# Patient Record
Sex: Female | Born: 1935 | Race: White | Hispanic: No | Marital: Married | Smoking: Former smoker
Health system: Southern US, Community
[De-identification: ages and names within clinical notes are randomized; demographics above are authoritative.]

## PROBLEM LIST (undated history)

## (undated) DIAGNOSIS — I639 Cerebral infarction, unspecified: Secondary | ICD-10-CM

## (undated) DIAGNOSIS — C801 Malignant (primary) neoplasm, unspecified: Secondary | ICD-10-CM

## (undated) DIAGNOSIS — I749 Embolism and thrombosis of unspecified artery: Secondary | ICD-10-CM

## (undated) DIAGNOSIS — I2510 Atherosclerotic heart disease of native coronary artery without angina pectoris: Secondary | ICD-10-CM

## (undated) DIAGNOSIS — E785 Hyperlipidemia, unspecified: Secondary | ICD-10-CM

## (undated) DIAGNOSIS — I10 Essential (primary) hypertension: Secondary | ICD-10-CM

## (undated) DIAGNOSIS — Z8719 Personal history of other diseases of the digestive system: Secondary | ICD-10-CM

## (undated) DIAGNOSIS — I4891 Unspecified atrial fibrillation: Secondary | ICD-10-CM

## (undated) DIAGNOSIS — M1990 Unspecified osteoarthritis, unspecified site: Secondary | ICD-10-CM

## (undated) DIAGNOSIS — Z8619 Personal history of other infectious and parasitic diseases: Secondary | ICD-10-CM

## (undated) HISTORY — DX: Unspecified atrial fibrillation: I48.91

## (undated) HISTORY — PX: TONSILLECTOMY: SUR1361

## (undated) HISTORY — DX: Personal history of other diseases of the digestive system: Z87.19

## (undated) HISTORY — DX: Personal history of other infectious and parasitic diseases: Z86.19

## (undated) HISTORY — DX: Cerebral infarction, unspecified: I63.9

## (undated) HISTORY — DX: Atherosclerotic heart disease of native coronary artery without angina pectoris: I25.10

## (undated) HISTORY — DX: Embolism and thrombosis of unspecified artery: I74.9

## (undated) HISTORY — DX: Hyperlipidemia, unspecified: E78.5

## (undated) HISTORY — DX: Malignant (primary) neoplasm, unspecified: C80.1

## (undated) HISTORY — PX: ABDOMINAL HYSTERECTOMY: SHX81

## (undated) HISTORY — DX: Essential (primary) hypertension: I10

## (undated) HISTORY — DX: Unspecified osteoarthritis, unspecified site: M19.90

## (undated) HISTORY — PX: APPENDECTOMY: SHX54

---

## 2011-05-30 ENCOUNTER — Inpatient Hospital Stay (HOSPITAL_COMMUNITY)
Admission: EM | Admit: 2011-05-30 | Discharge: 2011-06-04 | DRG: 263 | Disposition: A | Discharge status: Skilled Nursing Facility | Payer: Medicare Other | Attending: Vascular Surgery | Admitting: Vascular Surgery

## 2011-05-30 ENCOUNTER — Emergency Department (HOSPITAL_COMMUNITY): Payer: Medicare Other

## 2011-05-30 ENCOUNTER — Other Ambulatory Visit: Payer: Self-pay | Admitting: Vascular Surgery

## 2011-05-30 DIAGNOSIS — I824Y9 Acute embolism and thrombosis of unspecified deep veins of unspecified proximal lower extremity: Principal | ICD-10-CM | POA: Diagnosis present

## 2011-05-30 DIAGNOSIS — Z7901 Long term (current) use of anticoagulants: Secondary | ICD-10-CM

## 2011-05-30 DIAGNOSIS — I10 Essential (primary) hypertension: Secondary | ICD-10-CM | POA: Diagnosis present

## 2011-05-30 DIAGNOSIS — Z8673 Personal history of transient ischemic attack (TIA), and cerebral infarction without residual deficits: Secondary | ICD-10-CM

## 2011-05-30 DIAGNOSIS — I4891 Unspecified atrial fibrillation: Secondary | ICD-10-CM

## 2011-05-30 DIAGNOSIS — I743 Embolism and thrombosis of arteries of the lower extremities: Secondary | ICD-10-CM

## 2011-05-30 DIAGNOSIS — Y849 Medical procedure, unspecified as the cause of abnormal reaction of the patient, or of later complication, without mention of misadventure at the time of the procedure: Secondary | ICD-10-CM | POA: Diagnosis not present

## 2011-05-30 DIAGNOSIS — Z8542 Personal history of malignant neoplasm of other parts of uterus: Secondary | ICD-10-CM

## 2011-05-30 DIAGNOSIS — I824Z9 Acute embolism and thrombosis of unspecified deep veins of unspecified distal lower extremity: Secondary | ICD-10-CM | POA: Diagnosis present

## 2011-05-30 DIAGNOSIS — Y9210 Unspecified residential institution as the place of occurrence of the external cause: Secondary | ICD-10-CM | POA: Diagnosis not present

## 2011-05-30 DIAGNOSIS — Z882 Allergy status to sulfonamides status: Secondary | ICD-10-CM

## 2011-05-30 DIAGNOSIS — Z823 Family history of stroke: Secondary | ICD-10-CM

## 2011-05-30 DIAGNOSIS — B029 Zoster without complications: Secondary | ICD-10-CM | POA: Diagnosis present

## 2011-05-30 DIAGNOSIS — M1710 Unilateral primary osteoarthritis, unspecified knee: Secondary | ICD-10-CM | POA: Diagnosis present

## 2011-05-30 DIAGNOSIS — R110 Nausea: Secondary | ICD-10-CM | POA: Diagnosis not present

## 2011-05-30 DIAGNOSIS — Z79899 Other long term (current) drug therapy: Secondary | ICD-10-CM

## 2011-05-30 DIAGNOSIS — D62 Acute posthemorrhagic anemia: Secondary | ICD-10-CM | POA: Diagnosis not present

## 2011-05-30 DIAGNOSIS — E785 Hyperlipidemia, unspecified: Secondary | ICD-10-CM | POA: Diagnosis present

## 2011-05-30 DIAGNOSIS — Z96659 Presence of unspecified artificial knee joint: Secondary | ICD-10-CM

## 2011-05-30 DIAGNOSIS — IMO0002 Reserved for concepts with insufficient information to code with codable children: Secondary | ICD-10-CM | POA: Diagnosis not present

## 2011-05-30 DIAGNOSIS — I2510 Atherosclerotic heart disease of native coronary artery without angina pectoris: Secondary | ICD-10-CM | POA: Diagnosis present

## 2011-05-30 HISTORY — PX: OTHER SURGICAL HISTORY: SHX169

## 2011-05-30 LAB — HEPARIN LEVEL (UNFRACTIONATED): Heparin Unfractionated: 1.1 IU/mL — ABNORMAL HIGH (ref 0.30–0.70)

## 2011-05-30 LAB — CBC
MCH: 29.7 pg (ref 26.0–34.0)
MCHC: 33.3 g/dL (ref 30.0–36.0)
Platelets: 240 10*3/uL (ref 150–400)

## 2011-05-30 LAB — BASIC METABOLIC PANEL
BUN: 20 mg/dL (ref 6–23)
CO2: 28 mEq/L (ref 19–32)
Calcium: 8.8 mg/dL (ref 8.4–10.5)
GFR calc non Af Amer: >60 mL/min (ref >60)
Glucose, Bld: 125 mg/dL — ABNORMAL HIGH (ref 70–99)

## 2011-05-30 LAB — DIFFERENTIAL
Basophils Relative: 0 % (ref 0–1)
Eosinophils Absolute: 0.1 10*3/uL (ref 0.0–0.7)
Eosinophils Relative: 1 % (ref 0–5)
Lymphocytes Relative: 21 % (ref 12–46)
Monocytes Absolute: 0.7 10*3/uL (ref 0.1–1.0)
Monocytes Relative: 10 % (ref 3–12)
Neutro Abs: 5.1 10*3/uL (ref 1.7–7.7)
Neutrophils Relative %: 69 % (ref 43–77)

## 2011-05-30 LAB — TYPE AND SCREEN

## 2011-05-30 LAB — PROTIME-INR: Prothrombin Time: 15.1 seconds (ref 11.6–15.2)

## 2011-05-30 LAB — MRSA PCR SCREENING: MRSA by PCR: POSITIVE — AB

## 2011-05-31 LAB — CBC
MCV: 90.2 fL (ref 78.0–100.0)
Platelets: 240 10*3/uL (ref 150–400)
RBC: 3.89 MIL/uL (ref 3.87–5.11)
RDW: 13.9 % (ref 11.5–15.5)
WBC: 7.2 10*3/uL (ref 4.0–10.5)

## 2011-05-31 LAB — BASIC METABOLIC PANEL
Chloride: 103 mEq/L (ref 96–112)
Creatinine, Ser: 0.73 mg/dL (ref 0.50–1.10)
GFR calc Af Amer: >60 mL/min (ref >60)
GFR calc non Af Amer: >60 mL/min (ref >60)
Potassium: 3.8 mEq/L (ref 3.5–5.1)

## 2011-05-31 LAB — DIFFERENTIAL
Basophils Absolute: 0 10*3/uL (ref 0.0–0.1)
Eosinophils Absolute: 0.1 10*3/uL (ref 0.0–0.7)
Eosinophils Relative: 1 % (ref 0–5)
Lymphs Abs: 1.5 10*3/uL (ref 0.7–4.0)
Neutrophils Relative %: 64 % (ref 43–77)

## 2011-05-31 LAB — HEPARIN LEVEL (UNFRACTIONATED): Heparin Unfractionated: 0.67 IU/mL (ref 0.30–0.70)

## 2011-06-01 DIAGNOSIS — I4891 Unspecified atrial fibrillation: Secondary | ICD-10-CM

## 2011-06-01 DIAGNOSIS — I059 Rheumatic mitral valve disease, unspecified: Secondary | ICD-10-CM

## 2011-06-01 LAB — CBC
HCT: 29.9 % — ABNORMAL LOW (ref 36.0–46.0)
Hemoglobin: 10.4 g/dL — ABNORMAL LOW (ref 12.0–15.0)
MCV: 88.5 fL (ref 78.0–100.0)
RBC: 3.38 MIL/uL — ABNORMAL LOW (ref 3.87–5.11)
RDW: 13.3 % (ref 11.5–15.5)
WBC: 8.1 10*3/uL (ref 4.0–10.5)

## 2011-06-01 LAB — BASIC METABOLIC PANEL
CO2: 28 mEq/L (ref 19–32)
Chloride: 101 mEq/L (ref 96–112)
Creatinine, Ser: 0.82 mg/dL (ref 0.50–1.10)
GFR calc Af Amer: >60 mL/min (ref >60)
Sodium: 136 mEq/L (ref 135–145)

## 2011-06-01 LAB — DIFFERENTIAL
Basophils Absolute: 0 10*3/uL (ref 0.0–0.1)
Eosinophils Relative: 2 % (ref 0–5)
Lymphocytes Relative: 21 % (ref 12–46)
Lymphs Abs: 1.7 10*3/uL (ref 0.7–4.0)
Neutro Abs: 5.5 10*3/uL (ref 1.7–7.7)
Neutrophils Relative %: 67 % (ref 43–77)

## 2011-06-01 LAB — PROTIME-INR
INR: 1.14 (ref 0.00–1.49)
Prothrombin Time: 14.8 seconds (ref 11.6–15.2)

## 2011-06-02 DIAGNOSIS — M79609 Pain in unspecified limb: Secondary | ICD-10-CM

## 2011-06-02 LAB — DIFFERENTIAL
Basophils Relative: 0 % (ref 0–1)
Eosinophils Absolute: 0.1 10*3/uL (ref 0.0–0.7)
Lymphs Abs: 2.1 10*3/uL (ref 0.7–4.0)
Monocytes Relative: 14 % — ABNORMAL HIGH (ref 3–12)
Neutro Abs: 3.8 10*3/uL (ref 1.7–7.7)
Neutrophils Relative %: 54 % (ref 43–77)

## 2011-06-02 LAB — PROTIME-INR: INR: 1.66 — ABNORMAL HIGH (ref 0.00–1.49)

## 2011-06-02 LAB — CBC
Hemoglobin: 8.5 g/dL — ABNORMAL LOW (ref 12.0–15.0)
MCV: 88.2 fL (ref 78.0–100.0)
Platelets: 219 10*3/uL (ref 150–400)
RBC: 2.89 MIL/uL — ABNORMAL LOW (ref 3.87–5.11)
WBC: 7.1 10*3/uL (ref 4.0–10.5)

## 2011-06-02 LAB — HEPARIN LEVEL (UNFRACTIONATED): Heparin Unfractionated: 0.37 IU/mL (ref 0.30–0.70)

## 2011-06-02 NOTE — Consult Note (Signed)
NAMEMarland Kitchen  Connie Baker, Connie Baker NO.:  [id number]  MEDICAL RECORD NO.:  [id number]  LOCATION:  2307                         FACILITY:  [facility]  PHYSICIAN:  Luis Abed, MD, FACCDATE OF BIRTH:  09-Jun-1929  DATE OF CONSULTATION:  05/30/2011 DATE OF DISCHARGE:                                CONSULTATION   HISTORY OF PRESENT ILLNESS:  The patient is admitted with a clot in her left leg.  She has undergone left leg embolectomy and she is stabilizing.  She has atrial fibrillation.  The rate is controlled.  We are consulted to decide about medications going forward and to assess her cardiac status.  The patient is followed by Dr. Graciella Freer in [place].  With the patient and her family responding to my questions, there is no definite proof of coronary artery disease.  They tell me that she has had atrial fibrillation in the past.  She has also had CVAs.  She has also had a significant bleeding GI ulcer that required 2 units transfusion in September 2011.  She is admitted on aspirin and Plavix.  I will have to contact Dr. Graciella Freer to get more specific information on the history and the choice of antiplatelet medicines and anticoagulants over time.  The patient has not been having any chest pain or shortness of breath.  PAST MEDICAL HISTORY:  Allergies to SULFA.  MEDICATIONS: 1. Vitamin B12. 2. Plavix 75 daily. 3. Protonix 40 daily. 4. Metoprolol 25 daily. 5. Fish oil. 6. Lyrica. 7. Crestor.  OTHER MEDICAL PROBLEMS:  See the complete list below.  SOCIAL HISTORY:  She does not smoke.  FAMILY HISTORY:  There is no coronary disease at a young age.  REVIEW OF SYSTEMS:  The patient has some nausea now early post surgery. She denies fever, chills, headache, sweats, rash, change in vision, change in hearing, chest pain, cough, nausea, vomiting, urinary symptoms.  Her leg of course was very painful until it was embolectomized.  All other systems are reviewed and are  negative.  PHYSICAL EXAMINATION:  GENERAL/VITAL SIGNS:  The patient is nauseated but stable in bed.  Blood pressure is 130/94.  Her pulse rate is 94. The patient is oriented to person, time, and place.  Affect is normal. HEENT:  Head is atraumatic.  She has a cold washcloth on her forehead. NECK:  There is no carotid bruit.  There is no jugular venous distention. LUNGS:  Clear.  Respiratory effort is not labored. CARDIAC:  S1 with an S2.  The rhythm is irregularly irregular.  There is a soft systolic murmur. ABDOMEN:  Soft. EXTREMITIES:  Legs reveal no marked edema.  LABORATORY DATA:  EKG reveals atrial fibrillation.  There are no significant ST changes.  Hemoglobin is 12.1.  BUN is 20 with a creatinine of 0.89.  There is no chest x-ray available.  Problems include: 1. History of hypertension. 2. History of shingles with continued discomfort affecting her mid     abdomen and right posterior chest.  She uses Lyrica for this. 3. History of CVAs in the past.  We do not have more information on     this. 4. History of  a GI bleed requiring 2  units transfusion in September     2011. 5. Atrial fibrillation.  According to the patient and the family this     may be chronic.  This will have to be documented. 6. Acute left leg embolus followed by successful embolectomy.  We will obtain a 2-D echo.  We need more information from Dr. Graciella Freer. The patient is to be treated with IV heparin.  The patient had been on aspirin and Plavix.  We need more information to understand her atrial fib history and to understand if and why the patient can or cannot be treated with Coumadin.  At this point, Coumadin would appear to be the drug of choice.  The patient is uncomfortable postop with a heart rate of 102.  I suspect that her heart rate is controlled when she is more comfortable.  No further adjustment in her meds at this time.     Luis Abed, MD, [facility]     JDK/MEDQ  D:  05/30/2011  T:   05/30/2011  Job:  [id number]  cc:   Dr. Graciella Freer  Electronically Signed by Willa Rough MD [facility] on 06/02/2011 10:34:18 AM

## 2011-06-03 LAB — DIFFERENTIAL
Basophils Absolute: 0 10*3/uL (ref 0.0–0.1)
Basophils Relative: 0 % (ref 0–1)
Eosinophils Absolute: 0.1 10*3/uL (ref 0.0–0.7)
Monocytes Absolute: 1 10*3/uL (ref 0.1–1.0)
Neutro Abs: 6 10*3/uL (ref 1.7–7.7)

## 2011-06-03 LAB — CBC
Hemoglobin: 8.6 g/dL — ABNORMAL LOW (ref 12.0–15.0)
MCHC: 33.7 g/dL (ref 30.0–36.0)
Platelets: 252 10*3/uL (ref 150–400)
RDW: 13.8 % (ref 11.5–15.5)

## 2011-06-03 LAB — PREPARE RBC (CROSSMATCH)

## 2011-06-03 LAB — PROTIME-INR: Prothrombin Time: 26.2 seconds — ABNORMAL HIGH (ref 11.6–15.2)

## 2011-06-03 LAB — HEPARIN LEVEL (UNFRACTIONATED): Heparin Unfractionated: 0.33 IU/mL (ref 0.30–0.70)

## 2011-06-03 NOTE — Op Note (Signed)
NAMEMarland Kitchen  Connie Baker, Connie Baker NO.:  [id number]  MEDICAL RECORD NO.:  [id number]  LOCATION:  2307                         FACILITY:  [facility]  PHYSICIAN:  Fransisco Hertz, MD       DATE OF BIRTH:  [birth year]  DATE OF PROCEDURE:  05/30/2011 DATE OF DISCHARGE:                              OPERATIVE REPORT   PROCEDURES:  Left femoral-popliteal thromboembolectomy and tibial thromboembolectomy, also intraoperative angiogram.  PREOPERATIVE DIAGNOSIS:  Left superficial femoral artery and above-knee popliteal embolism.  POSTOPERATIVE DIAGNOSIS:  Left superficial femoral artery and above-knee popliteal embolism.  SURGEON:  Fransisco Hertz, MD  ASSISTANT:  Pecola Leisure, PA  ANESTHESIA:  General.  FINDINGS IN THIS CASE:  A retrieved embolus from the left femoral above- knee popliteal segment.  There was no thrombus obtained from the tibial vessels.  On the intraoperative angiogram, there was intact anterior tibial and posterior tibial runoff and there was no residual clot or disease noted within the superficial femoral artery.  DISPOSITION OF SPECIMEN:  To Pathology.  ESTIMATED BLOOD LOSS:  100 mL.  INDICATIONS:  This is an 77 year old female who presents with acute onset of the left leg pain.  Based on exam and history, I was concerned that she had in fact embolism to her left femoral popliteal segment.  She had a viable foot at that point, so I felt that proceeding with a thromboembolectomy in the left leg was indicated emergently.  She is aware of the risks of this procedure include bleeding, infection, possible death related due to myocardial infarction or stroke, possible need for fasciotomy, possible need for amputation, and possible need for additional procedure.  She is aware of this and agreed to proceed forward.  DESCRIPTION OF OPERATION:  After full informed written consent was obtained from the patient, she was brought back to the operating room and placed  supine upon the operating table.  Prior to induction, she had received IV antibiotics.  She was then prepped and draped in a standard fashion for left leg exploration, possible bypass.  I turned my attention to her left calf.  I rotated her out externally and placed a bump in the popliteal fossa.  I made an incision about one finger width posterior to the tibial prominence and then dissected through the subcutaneous tissue down through the fascia until I had exposure of the popliteal space.  I dissected out the popliteal vein and retracted it out of it away, and dissected out a good-sized popliteal artery almost 4 mm in diameter externally.  There was no evidence of clot at this level.  I dissected this artery out proximally and distally and obtained control with vessel loops.  Then, I made a transverse arteriotomy with a #11 blade.  Actually immediately after the arteriotomy, there was actually good backbleeding.  I passed the 4 Fogarty balloon up the popliteal artery into the superficial femoral artery and with the first pass extracted a large thrombus from the femoropopliteal segment. Immediately after this, I had good return of pulsatile bleeding.  At this point, I passed the Fogarty up 2 more times and no additional thrombus was obtained.  I then injected heparinized saline at this  point.  Note that prior to making arteriotomy and already given the patient 6500 units of heparin intravenously to obtain therapeutic anticoagulation, I then at this point turned my attention to the distal portion of the popliteal artery.  I passed the #3 Fogarty balloon distally.  On 3 different passes, I did not obtain any clot from the tibial.  At this point, I injected heparinized saline into this artery. At this point, we prepped the C-arm for intraoperative angiogram.  I then using a hand injection injected dye from the arteriotomy up into the femoral pop segment.  This demonstrated widely patent SFA  and above- knee popliteal segment that was widely patent down to the level of the arteriotomy.  I then injected contrast distally to visualize the rest of the popliteal artery and the tibial.  This demonstrated widely patent flow down to the level of the foot with runoff via anterior tibial and posterior tibial arteries.  At this point, this demonstrated successful thrombectomy in this patient, so I felt that no further intervention was going to be necessary.  At this point, I placed serrefine clamps on the popliteal artery proximally and distally and then repaired the artery with interrupted stitches of 7-0 Prolene.  After completing this closure of this arteriotomy, there was an easily palpable pulse throughout this popliteal artery.  I placed thrombin and Gelfoam into the popliteal space, checked for bleeding, and controlled it with electrocautery and then washed out this popliteal space.  There was no more active bleeding, so at this point I reapproximated the subcutaneous tissue with 2-0 Vicryl in a running fashion and then I stapled the skin back to reapproximate the skin.  Note, in this case as there was good backbleeding immediately upon opening the artery, this patient actually had persistent flow to the calf and tibial arteries despite the embolism and subsequently I do not think she will be at risk for reperfusion injury, though as a precaution I will keep serial exams on her in the ICU setting.  Additionally, as this patient now has proven and she has embolized in the setting of possibly new-onset atrial fibrillation, my plan is to start anticoagulation with no bolus heparin and also have her evaluated by Cardiology.  The patient is at risk for a bleeding complication  but I believe the benefits exceed the risk of re-embolization and possibly  losing the left leg to repeat embolic disease.  The patient woke without any problems and there were no complications. Condition was  stable.     Fransisco Hertz, MD     BLC/MEDQ  D:  05/30/2011  T:  05/30/2011  Job:  [id number]  Electronically Signed by Leonides Sake MD on 06/03/2011 09:09:23 AM

## 2011-06-03 NOTE — H&P (Signed)
NAME:  Connie Baker, Connie Baker NO.:  [id number]  MEDICAL RECORD NO.:  [id number]  LOCATION:  [facility]                         FACILITY:  [facility]  PHYSICIAN:  Fransisco Hertz, MD       DATE OF BIRTH:  1929-03-21  DATE OF ADMISSION:  05/30/2011 DATE OF DISCHARGE:                             HISTORY & PHYSICAL   HISTORY OF PRESENT ILLNESS:  This is a 77 year old patient that presents with chief complaint of left leg pain.  She notes sudden onset of pain which woke her from sleep in the left foot about 1 o'clock this morning. She never had any prior history of pain in this left foot, though she has had osteoarthritis in the left knee requiring total knee replacement on this side.  She has no prior history of intermittent claudication or rest pain.  She does have intermittent history of cardiac arrhythmias that were not managed with Coumadin due to history of GI bleeding. Currently, she denies any paresthesia or anesthesia in the left foot and is able to move her foot.  PAST MEDICAL HISTORY: 1. Coronary artery disease. 2. History of cardiac arrhythmia. 3. Osteoarthritis. 4. Hypertension. 5. Hyperlipidemia. 6. Shingles. 7. Uterine cancer. 8. Appendicitis. 9. History of stroke.  PAST SURGICAL HISTORY: 1. Appendectomy. 2. Total abdominal hysterectomy. 3. Tonsillectomy.  SOCIAL HISTORY:  No tobacco, alcohol, or illicit drug use.  FAMILY HISTORY:  Father died at 79 of a stroke.  Mother died of old age at 42.  REVIEW OF SYSTEMS:  Notable for GI bleed history, possibly related to peptic ulcer disease, recurrent nausea without any emesis.  Otherwise, the rest of review of systems was noted to be negative by the patient.  MEDICATIONS:  Vitamin B12, Plavix, Protonix, metoprolol, fish oil, Allegra, Crestor.  This is an incomplete list as the patient's is not fully aware of all of her medications.  ALLERGIES:  SULFA.  PHYSICAL EXAMINATION:  VITAL SIGNS:  Blood pressure  135/71, heart rate 60, respirations were 20, and saturating 97% on room air. GENERAL:  Well developed, well nourished, elderly, alert and oriented x3. HEAD:  Normocephalic and atraumatic. ENT:  Oropharynx without any erythema or exudate.  Hearing is intact. Nares without any drainage. NECK:  Supple neck.  No nuchal rigidity.  No lymphadenopathy. EYES.  Pupils were equal, round, and reactive to light.  Extraocular movements are intact. PULMONARY:  Symmetric expansion.  Good air movement.  No rales, rhonchi, or wheezing. CARDIAC:  Irregularly irregular rate and rhythm.  No murmurs, rubs, thrills, or gallops. VASCULAR:  There are palpable pulses throughout all extremities except the left in which there is palpable left femoral, but no popliteal or pedal.  This is markedly different from the right side where there are strong pulses throughout the right leg including extremely strong pulses in the feet. ABDOMEN:  Soft abdomen, nontender, nondistended. No guarding, no rebound.  Incisions are consist with previous surgical history. MUSCULOSKELETAL:  She has 5/5 strength throughout except the left leg where she is only 4/5 leg lift and dorsiflex and plantarflexion are 4/5 NEURO:  Cranial nerves II-XII are intact.  Motor exam was as above. Sensation was intact throughout including the left foot.  PSYCH:  Judgment is intact.  Mood and affect are appropriate for clinical situation. SKIN:  No rashes. EXTREMITIES:  As described above.  Specifically, the left foot does not demonstrates frank pallor nor does it demonstrate any rubor. LYMPHATIC:  No lymphadenopathy in the cervical, axillary, or inguinal lymph node basins.  LABORATORY STUDIES:  Chemistry:  Sodium 140, potassium 3.7, chloride 104, bicarb 28, glucose 125, BUN 20, creatinine 0.89.  CBC with white count of 7.5, H and H of 12.1 and 36.3, and platelets are 240.  Coags: The PT is 15.1 and INR 1.17, PTT is 28.  MEDICAL DECISION MAKING:   This is an 77 year old female with multiple comorbidities including coronary artery disease and history of multiple strokes that presents with possibly new onset atrial fibrillation, though from the patient's history it sounds like she previously may have had intermittent episodes of arrhythmia.  Based on the marked difference in examination of the legs, she likely has an embolism to her left femoral- popliteal arterial segments.  The left leg is viable at this point, so I think thromboembolectomy is indicated emergently.  The patient is aware of the risks of this procedure including bleeding, infection, death due to MI or stroke, possible need for amputation, and possible need for additional procedures in the future.  Additionally, given the time frame, I suspect she will possibly need a prophylactic four-compartment fasciotomy.  If on exploring the compartments there is no significant muscle bulge, then at that point we may just go ahead and close the skin.  After completing this thromboembolectomy, she will need Cardiology to evaluate her and then a frank risk and benefit analysis in regard to the benefits of proceeding with anticoagulation.     Fransisco Hertz, MD     BLC/MEDQ  D:  05/30/2011  T:  05/30/2011  Job:  [id number]  Electronically Signed by Leonides Sake MD on 06/03/2011 09:05:50 AM

## 2011-06-04 LAB — TYPE AND SCREEN
ABO/RH(D): O POS
Antibody Screen: NEGATIVE

## 2011-06-04 LAB — CBC
MCHC: 33.8 g/dL (ref 30.0–36.0)
Platelets: 271 10*3/uL (ref 150–400)
RDW: 14.2 % (ref 11.5–15.5)
WBC: 8.4 10*3/uL (ref 4.0–10.5)

## 2011-06-04 LAB — PROTIME-INR
INR: 2.18 — ABNORMAL HIGH (ref 0.00–1.49)
Prothrombin Time: 24.6 seconds — ABNORMAL HIGH (ref 11.6–15.2)

## 2011-06-04 NOTE — Discharge Summary (Addendum)
NAMEMarland Baker  CANNIE, MUCKLE NO.:  [id number]  MEDICAL RECORD NO.:  [id number]  LOCATION:  2007                         FACILITY:  [facility]  PHYSICIAN:  Fransisco Hertz, MD       DATE OF BIRTH:  May 27, 1929  DATE OF ADMISSION:  05/30/2011 DATE OF DISCHARGE:                              DISCHARGE SUMMARY   HISTORY OF PRESENT ILLNESS:  Connie Baker is a 77 year old patient who is on Plavix and presented with a chief complaint of left leg pain.  She noted sudden onset of pain which woke her from sleep at about 1 o'clock in the morning on the day of admission.  She had never had any prior history of pain in the left foot though she had had recent total knee replacement for osteoarthritis.  She had no prior history of intermittent claudication or rest pain.  She had some cardiac arrhythmias that were not managed with Coumadin due to history of GI bleeding.  She was on Plavix.  Currently, she denied any paresthesias or dysesthesias in the left foot and she was able to move her left foot. She was taken to the operating room emergently for a thromboembolectomy.  PAST MEDICAL HISTORY:  Significant for: 1. Coronary artery disease. 2. Atrial fib. 3. Osteoarthritis. 4. Hypertension. 5. Hyperlipidemia. 6. Shingles. 7. Uterine cancer. 8. History of stroke.  HOSPITAL COURSE:  The patient was taken to the operating room emergently on June 16 for a left femoral popliteal thromboembolectomy and tibial thromboembolectomy with intraoperative arteriogram.  Postoperatively, the patient did well.  She has been on heparin drip and Coumadin.  She had a hematoma in the left calf on the heparin drip, however she did not have any DVT.  She was begun on physical therapy and occupational therapy.  Her wounds were healing well.  Her calf was firm but she was able to have good motion of her foot and good sensation in the foot. Foot was warm.  She had palpable DP pulse.  The foot was warm and  pink. Ultrasound showed no DVT, but hematoma behind the knee secondary to surgery.  Her heparin drip was stopped on June 02, 2011 with an INR of 2.36.  She was continued on the Coumadin and will have PT/INR drawn appropriately for changes to her Coumadin level.  Dr. Dulce Sellar in [place] with her cardiologist and he will follow her Coumadin when she is discharged from the SNF.  She will be discharged to SNF when a bed is available and she will follow up with Dr. Imogene Burn in 2 weeks.  DIAGNOSES: 1. Thromboembolus of the left femoral tibial artery status post     thrombectomy. 2. Atrial fibrillation on Coumadin with therapeutic INR. 3. Postoperative anemia treated with 1 unit of packed cells secondary     to hematoma in the left lower extremity around the incision site.     All her other medical conditions were stable and treated with her     present home regimen.  DISPOSITION: 1. The patient will be discharged to skilled nursing facility for     PT/OT. 2. She will follow up with Dr. Dulce Sellar.  Please arrange this after she  is done with the SNF or Coumadin Clinic in [place].  DISCHARGE MEDICATIONS: 1. Dulcolax suppository daily as needed for constipation. 2. Oxycodone 5 mg 1-2 tablets every 4 hours as needed for pain. 3. Detrol 1 mg twice daily. 4. Warfarin 2.5 mg 6 p.m. daily. 5. Metoprolol 25 mg 1-1/2 tablets 2 times daily. 6. Crestor 5 mg daily at bedtime. 7. Fish oil daily. 8. Lyrica 75 mg daily at bedtime as needed. 9. Pantoprazole 40 mg daily. 10.Vitamin B daily. She is to stop her Plavix that she had been taking at home.     Della Goo, PA-C   ______________________________ Fransisco Hertz, MD    RR/MEDQ  D:  06/04/2011  T:  06/04/2011  Job:  [id number]  Electronically Signed by Della Goo PA on 06/04/2011 11:21:59 AM Electronically Signed by Leonides Sake MD on 06/08/2011 07:31:28 AM

## 2011-06-26 ENCOUNTER — Ambulatory Visit (INDEPENDENT_AMBULATORY_CARE_PROVIDER_SITE_OTHER): Payer: Medicare Other | Admitting: Vascular Surgery

## 2011-06-26 ENCOUNTER — Ambulatory Visit: Payer: Medicare Other | Admitting: Vascular Surgery

## 2011-06-26 ENCOUNTER — Encounter: Payer: Self-pay | Admitting: Vascular Surgery

## 2011-06-26 VITALS — BP 126/78 | HR 67 | Resp 18 | Ht 62.0 in | Wt 174.0 lb

## 2011-06-26 DIAGNOSIS — I743 Embolism and thrombosis of arteries of the lower extremities: Secondary | ICD-10-CM | POA: Insufficient documentation

## 2011-06-26 NOTE — Progress Notes (Signed)
Post op f/u lt fem pop thrombectomy

## 2011-06-26 NOTE — Progress Notes (Signed)
[facility]  Postoperative Visit  History of Present Illness  Connie Baker is a 77 y.o. year old female who presents for postoperative follow-up for: left femoropopliteal and tibial thromboembolectomy (Date: 05/30/11).  The patient's wounds are healed.  The patient notes improved swelling in the left leg.  The patient is able to complete their activities of daily living.  The patient's current symptoms are: continued stiffness in left leg and mild behind the knee pain.  She has completed inpatient rehab and is now walking with a walker.  Physical Examination  Filed Vitals:   06/26/11 1202  BP: 126/78  Pulse: 67  Resp: 18   LLE: Incision is healed, staples are still in place, pedal pulses in the left foot, some evidence of CVI with lipodermatosclerosis  Medical Decision Making  Connie Baker is a 77 y.o. year old female who presents s/p left femoropopliteal and tibial thromboembolectomy.  The patient's bypass incisions are healing appropriately with resolution of pre-operative symptoms.  The patient has evidence of chronic venous insufficiency, so I suspect some of her left leg swelling is secondary to CVI also and her operative status. I discussed in depth with the patient the nature of atherosclerosis, and emphasized the importance of maximal medical management including strict control of blood pressure, blood glucose, and lipid levels, obtaining regular exercise, and cessation of smoking.  The patient is aware that without maximal medical management the underlying atherosclerotic disease process will progress, limiting the benefit of any interventions. The patient's surveillance will included ABIs which will be completed in: 3 months, at which time the patient will be re-evaluated.   The patient agrees to participate in their maximal medical care and routine surveillance.  Thank you for allowing [place] to participate in this patient's care.  Leonides Sake, MD [facility] [place] Office: [phone] Pager: [phone]

## 2011-07-03 ENCOUNTER — Ambulatory Visit: Payer: Medicare Other | Admitting: Vascular Surgery

## 2011-08-26 ENCOUNTER — Encounter: Payer: Self-pay | Admitting: Vascular Surgery

## 2011-10-09 ENCOUNTER — Ambulatory Visit: Payer: Medicare Other | Admitting: Vascular Surgery

## 2014-05-10 ENCOUNTER — Ambulatory Visit: Payer: Self-pay

## 2014-05-24 ENCOUNTER — Ambulatory Visit: Payer: Self-pay

## 2014-12-19 DIAGNOSIS — Z7901 Long term (current) use of anticoagulants: Secondary | ICD-10-CM | POA: Diagnosis not present

## 2014-12-26 DIAGNOSIS — Z7901 Long term (current) use of anticoagulants: Secondary | ICD-10-CM | POA: Diagnosis not present

## 2015-01-24 DIAGNOSIS — Z7901 Long term (current) use of anticoagulants: Secondary | ICD-10-CM | POA: Diagnosis not present

## 2015-02-05 DIAGNOSIS — Z7901 Long term (current) use of anticoagulants: Secondary | ICD-10-CM | POA: Diagnosis not present

## 2015-02-18 DIAGNOSIS — L608 Other nail disorders: Secondary | ICD-10-CM | POA: Diagnosis not present

## 2015-02-18 DIAGNOSIS — L84 Corns and callosities: Secondary | ICD-10-CM | POA: Diagnosis not present

## 2015-02-18 DIAGNOSIS — Z6836 Body mass index (BMI) 36.0-36.9, adult: Secondary | ICD-10-CM | POA: Diagnosis not present

## 2015-03-07 DIAGNOSIS — S81859A Open bite, unspecified lower leg, initial encounter: Secondary | ICD-10-CM | POA: Diagnosis not present

## 2015-03-07 DIAGNOSIS — Z6836 Body mass index (BMI) 36.0-36.9, adult: Secondary | ICD-10-CM | POA: Diagnosis not present

## 2015-03-14 ENCOUNTER — Ambulatory Visit (INDEPENDENT_AMBULATORY_CARE_PROVIDER_SITE_OTHER): Payer: Commercial Managed Care - HMO

## 2015-03-14 VITALS — BP 131/72 | HR 74 | Resp 12

## 2015-03-14 DIAGNOSIS — B351 Tinea unguium: Secondary | ICD-10-CM | POA: Diagnosis not present

## 2015-03-14 DIAGNOSIS — M79676 Pain in unspecified toe(s): Secondary | ICD-10-CM

## 2015-03-14 DIAGNOSIS — Z7901 Long term (current) use of anticoagulants: Secondary | ICD-10-CM | POA: Diagnosis not present

## 2015-03-14 DIAGNOSIS — Q828 Other specified congenital malformations of skin: Secondary | ICD-10-CM | POA: Diagnosis not present

## 2015-03-14 NOTE — Patient Instructions (Signed)
Onychomycosis/Fungal Toenails  WHAT IS IT? An infection that lies within the keratin of your nail plate that is caused by a fungus.  WHY ME? Fungal infections affect all ages, sexes, races, and creeds.  There may be many factors that predispose you to a fungal infection such as age, coexisting medical conditions such as diabetes, or an autoimmune disease; stress, medications, fatigue, genetics, etc.  Bottom line: fungus thrives in a warm, moist environment and your shoes offer such a location.  IS IT CONTAGIOUS? Theoretically, yes.  You do not want to share shoes, nail clippers or files with someone who has fungal toenails.  Walking around barefoot in the same room or sleeping in the same bed is unlikely to transfer the organism.  It is important to realize, however, that fungus can spread easily from one nail to the next on the same foot.  HOW DO WE TREAT THIS?  There are several ways to treat this condition.  Treatment may depend on many factors such as age, medications, pregnancy, liver and kidney conditions, etc.  It is best to ask your doctor which options are available to you.  1. No treatment.   Unlike many other medical concerns, you can live with this condition.  However for many people this can be a painful condition and may lead to ingrown toenails or a bacterial infection.  It is recommended that you keep the nails cut short to help reduce the amount of fungal nail. 2. Topical treatment.  These range from herbal remedies to prescription strength nail lacquers.  About 40-50% effective, topicals require twice daily application for approximately 9 to 12 months or until an entirely new nail has grown out.  The most effective topicals are medical grade medications available through physicians offices. 3. Oral antifungal medications.  With an 80-90% cure rate, the most common oral medication requires 3 to 4 months of therapy and stays in your system for a year as the new nail grows out.  Oral  antifungal medications do require blood work to make sure it is a safe drug for you.  A liver function panel will be performed prior to starting the medication and after the first month of treatment.  It is important to have the blood work performed to avoid any harmful side effects.  In general, this medication safe but blood work is required. 4. Laser Therapy.  This treatment is performed by applying a specialized laser to the affected nail plate.  This therapy is noninvasive, fast, and non-painful.  It is not covered by insurance and is therefore, out of pocket.  The results have been very good with a 80-95% cure rate.  The [facility] is the only practice in the area to offer this therapy. 5. Permanent Nail Avulsion.  Removing the entire nail so that a new nail will not grow back.   Fungi-Nail can be purchased at any pharmacy without prescription. Apply Fungi-Nail once or twice daily to the affected toenails for a minimum of 12 months.  Shoe recommendations consider SAS, Rockport's, easy spirits, new balance athletic shoes. Consider shoes with a Velcro closure rather than lacing

## 2015-03-14 NOTE — Progress Notes (Signed)
   Subjective:    Patient ID: Connie Baker, female    DOB: [birth year], 81 y.o.   MRN: [id number]  HPI  PT REQUESTING FOR TOENAILS AND SKIN DEBRIDEMENT  Review of Systems  Cardiovascular: Positive for leg swelling.  Hematological: Bruises/bleeds easily.  Psychiatric/Behavioral: Positive for confusion.       Objective:   Physical Exam 81 year old white female process with family member at this time with complaint of painful nails as well as multiple keratoses both feet patient does have a history of stroke 3 currently on Coumadin past her granddaughter has been trimming nails however can only get them nails are thick hypertrophic painful tender and friable with discoloration. Also callusing at the MTP and IP joints of both great toes. Objective findings reveal vascular status to be intact DP plus one PT plus one over 4 bilateral Refill time 3 seconds all digits mild varicosities +1 edema bilateral. Neurologically epicritic and proprioceptive sensations appear to be intact and symmetric there is normal plantar response DTRs could not be elicited there may be some decreased sensation to the toes. This may be associated with her history of stroke. On orthopedic biomechanical exam rectus foot type mild digital contractures noted there is keratoses of the MTP and IP joint of the hallux. Digital contractures mild HAV deformity there is also thickening nails thickening dystrophy friability brittleness of nails 1 through 5 bilateral with severe gratified assistant discoloration tenderness on palpation ambulation and include shoe wear.       Assessment & Plan:  Assessment this time patient does have history of stroke posse mild neuropathy however my concern at this time is painful mycotic nails 1 through 5 bilateral nails are debrided both hallux nails are debrided and treated with limited cane and Neosporin will initiate topical antifungal therapy such as Fungi-Nail applied daily or twice daily to  the affected nails for 12 month duration instructions for fungal nail treatment are given patient will maintain accommodative shoes such as an SAS or [place] or new balance at this time also debridement of multiple keratoses both great toes HAV deformity and digital contractures return every 3 months for palliative care as recommended monitor for at least child's of changes or complications again painful mycotic nails no secondary bacterial infections noted however we'll monitoring continue with nail care daily weekly a she does have tense he for ingrowing nails of the hallux bilateral. Follow-up in 3 months  Harriet Masson DPM

## 2015-03-15 ENCOUNTER — Telehealth: Payer: Self-pay | Admitting: *Deleted

## 2015-03-15 NOTE — Telephone Encounter (Addendum)
Pt states she was seen yesterday, and the checkout paper's medication list was wrong, and she would like to rectify that.  I called pt and went over her medications, made the necessary changes and mailed a copy to the pt for her records.

## 2015-03-19 DIAGNOSIS — I10 Essential (primary) hypertension: Secondary | ICD-10-CM | POA: Diagnosis not present

## 2015-03-19 DIAGNOSIS — Z7901 Long term (current) use of anticoagulants: Secondary | ICD-10-CM | POA: Diagnosis not present

## 2015-03-19 DIAGNOSIS — I2510 Atherosclerotic heart disease of native coronary artery without angina pectoris: Secondary | ICD-10-CM | POA: Diagnosis not present

## 2015-03-19 DIAGNOSIS — Z5181 Encounter for therapeutic drug level monitoring: Secondary | ICD-10-CM | POA: Diagnosis not present

## 2015-03-26 DIAGNOSIS — I2510 Atherosclerotic heart disease of native coronary artery without angina pectoris: Secondary | ICD-10-CM | POA: Diagnosis not present

## 2015-03-26 DIAGNOSIS — Z7901 Long term (current) use of anticoagulants: Secondary | ICD-10-CM | POA: Diagnosis not present

## 2015-03-26 DIAGNOSIS — I10 Essential (primary) hypertension: Secondary | ICD-10-CM | POA: Diagnosis not present

## 2015-03-26 DIAGNOSIS — Z5181 Encounter for therapeutic drug level monitoring: Secondary | ICD-10-CM | POA: Diagnosis not present

## 2015-04-02 DIAGNOSIS — R791 Abnormal coagulation profile: Secondary | ICD-10-CM | POA: Diagnosis not present

## 2015-04-08 DIAGNOSIS — Z7901 Long term (current) use of anticoagulants: Secondary | ICD-10-CM | POA: Diagnosis not present

## 2015-04-08 DIAGNOSIS — S81859A Open bite, unspecified lower leg, initial encounter: Secondary | ICD-10-CM | POA: Diagnosis not present

## 2015-05-08 DIAGNOSIS — Z7901 Long term (current) use of anticoagulants: Secondary | ICD-10-CM | POA: Diagnosis not present

## 2015-05-22 DIAGNOSIS — Z7901 Long term (current) use of anticoagulants: Secondary | ICD-10-CM | POA: Diagnosis not present

## 2015-05-29 DIAGNOSIS — Z7901 Long term (current) use of anticoagulants: Secondary | ICD-10-CM | POA: Diagnosis not present

## 2015-06-12 DIAGNOSIS — Z7901 Long term (current) use of anticoagulants: Secondary | ICD-10-CM | POA: Diagnosis not present

## 2015-06-13 ENCOUNTER — Ambulatory Visit: Payer: Commercial Managed Care - HMO

## 2015-07-03 DIAGNOSIS — Z7901 Long term (current) use of anticoagulants: Secondary | ICD-10-CM | POA: Diagnosis not present

## 2015-07-17 DIAGNOSIS — Z7901 Long term (current) use of anticoagulants: Secondary | ICD-10-CM | POA: Diagnosis not present

## 2015-08-12 DIAGNOSIS — R791 Abnormal coagulation profile: Secondary | ICD-10-CM | POA: Diagnosis not present

## 2015-08-20 DIAGNOSIS — Z7901 Long term (current) use of anticoagulants: Secondary | ICD-10-CM | POA: Diagnosis not present

## 2015-09-19 DIAGNOSIS — Z7901 Long term (current) use of anticoagulants: Secondary | ICD-10-CM | POA: Diagnosis not present

## 2015-09-30 DIAGNOSIS — Z7901 Long term (current) use of anticoagulants: Secondary | ICD-10-CM | POA: Diagnosis not present

## 2015-09-30 DIAGNOSIS — Z23 Encounter for immunization: Secondary | ICD-10-CM | POA: Diagnosis not present

## 2015-10-08 DIAGNOSIS — L989 Disorder of the skin and subcutaneous tissue, unspecified: Secondary | ICD-10-CM | POA: Diagnosis not present

## 2015-10-08 DIAGNOSIS — H539 Unspecified visual disturbance: Secondary | ICD-10-CM | POA: Diagnosis not present

## 2015-10-14 DIAGNOSIS — Z7901 Long term (current) use of anticoagulants: Secondary | ICD-10-CM | POA: Diagnosis not present

## 2015-12-02 DIAGNOSIS — R05 Cough: Secondary | ICD-10-CM | POA: Diagnosis not present

## 2015-12-02 DIAGNOSIS — Z6837 Body mass index (BMI) 37.0-37.9, adult: Secondary | ICD-10-CM | POA: Diagnosis not present

## 2015-12-02 DIAGNOSIS — J22 Unspecified acute lower respiratory infection: Secondary | ICD-10-CM | POA: Diagnosis not present

## 2015-12-02 DIAGNOSIS — H6120 Impacted cerumen, unspecified ear: Secondary | ICD-10-CM | POA: Diagnosis not present

## 2015-12-17 DIAGNOSIS — K219 Gastro-esophageal reflux disease without esophagitis: Secondary | ICD-10-CM | POA: Diagnosis not present

## 2015-12-17 DIAGNOSIS — I2510 Atherosclerotic heart disease of native coronary artery without angina pectoris: Secondary | ICD-10-CM | POA: Diagnosis not present

## 2015-12-17 DIAGNOSIS — E039 Hypothyroidism, unspecified: Secondary | ICD-10-CM | POA: Diagnosis not present

## 2015-12-17 DIAGNOSIS — I10 Essential (primary) hypertension: Secondary | ICD-10-CM | POA: Diagnosis not present

## 2015-12-17 DIAGNOSIS — E559 Vitamin D deficiency, unspecified: Secondary | ICD-10-CM | POA: Diagnosis not present

## 2015-12-17 DIAGNOSIS — Z7901 Long term (current) use of anticoagulants: Secondary | ICD-10-CM | POA: Diagnosis not present

## 2015-12-17 DIAGNOSIS — E785 Hyperlipidemia, unspecified: Secondary | ICD-10-CM | POA: Diagnosis not present

## 2015-12-17 DIAGNOSIS — Z79899 Other long term (current) drug therapy: Secondary | ICD-10-CM | POA: Diagnosis not present

## 2015-12-17 DIAGNOSIS — N289 Disorder of kidney and ureter, unspecified: Secondary | ICD-10-CM | POA: Diagnosis not present

## 2016-01-15 DIAGNOSIS — Z6837 Body mass index (BMI) 37.0-37.9, adult: Secondary | ICD-10-CM | POA: Diagnosis not present

## 2016-01-15 DIAGNOSIS — F0390 Unspecified dementia without behavioral disturbance: Secondary | ICD-10-CM | POA: Diagnosis not present

## 2016-01-15 DIAGNOSIS — Z7901 Long term (current) use of anticoagulants: Secondary | ICD-10-CM | POA: Diagnosis not present

## 2016-01-30 DIAGNOSIS — B0223 Postherpetic polyneuropathy: Secondary | ICD-10-CM | POA: Diagnosis not present

## 2016-01-30 DIAGNOSIS — R109 Unspecified abdominal pain: Secondary | ICD-10-CM | POA: Diagnosis not present

## 2016-01-30 DIAGNOSIS — E669 Obesity, unspecified: Secondary | ICD-10-CM | POA: Diagnosis not present

## 2016-01-30 DIAGNOSIS — J309 Allergic rhinitis, unspecified: Secondary | ICD-10-CM | POA: Diagnosis not present

## 2016-01-30 DIAGNOSIS — Z6837 Body mass index (BMI) 37.0-37.9, adult: Secondary | ICD-10-CM | POA: Diagnosis not present

## 2016-02-10 DIAGNOSIS — J45909 Unspecified asthma, uncomplicated: Secondary | ICD-10-CM | POA: Diagnosis not present

## 2016-02-10 DIAGNOSIS — Z7901 Long term (current) use of anticoagulants: Secondary | ICD-10-CM | POA: Diagnosis not present

## 2016-02-10 DIAGNOSIS — Z6836 Body mass index (BMI) 36.0-36.9, adult: Secondary | ICD-10-CM | POA: Diagnosis not present

## 2016-02-10 DIAGNOSIS — J22 Unspecified acute lower respiratory infection: Secondary | ICD-10-CM | POA: Diagnosis not present

## 2016-02-10 DIAGNOSIS — R05 Cough: Secondary | ICD-10-CM | POA: Diagnosis not present

## 2016-02-10 DIAGNOSIS — Z79899 Other long term (current) drug therapy: Secondary | ICD-10-CM | POA: Diagnosis not present

## 2016-02-14 DIAGNOSIS — J069 Acute upper respiratory infection, unspecified: Secondary | ICD-10-CM | POA: Diagnosis not present

## 2016-02-14 DIAGNOSIS — Z6837 Body mass index (BMI) 37.0-37.9, adult: Secondary | ICD-10-CM | POA: Diagnosis not present

## 2016-02-21 DIAGNOSIS — Z6837 Body mass index (BMI) 37.0-37.9, adult: Secondary | ICD-10-CM | POA: Diagnosis not present

## 2016-02-21 DIAGNOSIS — S81859A Open bite, unspecified lower leg, initial encounter: Secondary | ICD-10-CM | POA: Diagnosis not present

## 2016-02-26 DIAGNOSIS — S81859A Open bite, unspecified lower leg, initial encounter: Secondary | ICD-10-CM | POA: Diagnosis not present

## 2016-02-26 DIAGNOSIS — Z6837 Body mass index (BMI) 37.0-37.9, adult: Secondary | ICD-10-CM | POA: Diagnosis not present

## 2016-03-09 DIAGNOSIS — Z7901 Long term (current) use of anticoagulants: Secondary | ICD-10-CM | POA: Diagnosis not present

## 2016-03-16 DIAGNOSIS — Z7901 Long term (current) use of anticoagulants: Secondary | ICD-10-CM | POA: Diagnosis not present

## 2016-04-02 DIAGNOSIS — E559 Vitamin D deficiency, unspecified: Secondary | ICD-10-CM | POA: Diagnosis not present

## 2016-04-02 DIAGNOSIS — Z6837 Body mass index (BMI) 37.0-37.9, adult: Secondary | ICD-10-CM | POA: Diagnosis not present

## 2016-04-02 DIAGNOSIS — Z1389 Encounter for screening for other disorder: Secondary | ICD-10-CM | POA: Diagnosis not present

## 2016-04-02 DIAGNOSIS — Z79899 Other long term (current) drug therapy: Secondary | ICD-10-CM | POA: Diagnosis not present

## 2016-04-02 DIAGNOSIS — R627 Adult failure to thrive: Secondary | ICD-10-CM | POA: Diagnosis not present

## 2016-04-06 DIAGNOSIS — Z6837 Body mass index (BMI) 37.0-37.9, adult: Secondary | ICD-10-CM | POA: Diagnosis not present

## 2016-04-06 DIAGNOSIS — M791 Myalgia: Secondary | ICD-10-CM | POA: Diagnosis not present

## 2016-04-06 DIAGNOSIS — M6281 Muscle weakness (generalized): Secondary | ICD-10-CM | POA: Diagnosis not present

## 2016-04-06 DIAGNOSIS — N189 Chronic kidney disease, unspecified: Secondary | ICD-10-CM | POA: Diagnosis not present

## 2016-04-06 DIAGNOSIS — R627 Adult failure to thrive: Secondary | ICD-10-CM | POA: Diagnosis not present

## 2016-04-06 DIAGNOSIS — Z602 Problems related to living alone: Secondary | ICD-10-CM | POA: Diagnosis not present

## 2016-04-06 DIAGNOSIS — F329 Major depressive disorder, single episode, unspecified: Secondary | ICD-10-CM | POA: Diagnosis not present

## 2016-04-06 DIAGNOSIS — I2510 Atherosclerotic heart disease of native coronary artery without angina pectoris: Secondary | ICD-10-CM | POA: Diagnosis not present

## 2016-04-06 DIAGNOSIS — I129 Hypertensive chronic kidney disease with stage 1 through stage 4 chronic kidney disease, or unspecified chronic kidney disease: Secondary | ICD-10-CM | POA: Diagnosis not present

## 2016-04-07 DIAGNOSIS — F329 Major depressive disorder, single episode, unspecified: Secondary | ICD-10-CM | POA: Diagnosis not present

## 2016-04-07 DIAGNOSIS — Z6837 Body mass index (BMI) 37.0-37.9, adult: Secondary | ICD-10-CM | POA: Diagnosis not present

## 2016-04-07 DIAGNOSIS — I2510 Atherosclerotic heart disease of native coronary artery without angina pectoris: Secondary | ICD-10-CM | POA: Diagnosis not present

## 2016-04-07 DIAGNOSIS — M791 Myalgia: Secondary | ICD-10-CM | POA: Diagnosis not present

## 2016-04-07 DIAGNOSIS — R627 Adult failure to thrive: Secondary | ICD-10-CM | POA: Diagnosis not present

## 2016-04-07 DIAGNOSIS — M6281 Muscle weakness (generalized): Secondary | ICD-10-CM | POA: Diagnosis not present

## 2016-04-07 DIAGNOSIS — I129 Hypertensive chronic kidney disease with stage 1 through stage 4 chronic kidney disease, or unspecified chronic kidney disease: Secondary | ICD-10-CM | POA: Diagnosis not present

## 2016-04-07 DIAGNOSIS — N189 Chronic kidney disease, unspecified: Secondary | ICD-10-CM | POA: Diagnosis not present

## 2016-04-07 DIAGNOSIS — Z602 Problems related to living alone: Secondary | ICD-10-CM | POA: Diagnosis not present

## 2016-04-08 DIAGNOSIS — Z602 Problems related to living alone: Secondary | ICD-10-CM | POA: Diagnosis not present

## 2016-04-08 DIAGNOSIS — R627 Adult failure to thrive: Secondary | ICD-10-CM | POA: Diagnosis not present

## 2016-04-08 DIAGNOSIS — N189 Chronic kidney disease, unspecified: Secondary | ICD-10-CM | POA: Diagnosis not present

## 2016-04-08 DIAGNOSIS — I2510 Atherosclerotic heart disease of native coronary artery without angina pectoris: Secondary | ICD-10-CM | POA: Diagnosis not present

## 2016-04-08 DIAGNOSIS — F329 Major depressive disorder, single episode, unspecified: Secondary | ICD-10-CM | POA: Diagnosis not present

## 2016-04-08 DIAGNOSIS — M791 Myalgia: Secondary | ICD-10-CM | POA: Diagnosis not present

## 2016-04-08 DIAGNOSIS — Z6837 Body mass index (BMI) 37.0-37.9, adult: Secondary | ICD-10-CM | POA: Diagnosis not present

## 2016-04-08 DIAGNOSIS — M6281 Muscle weakness (generalized): Secondary | ICD-10-CM | POA: Diagnosis not present

## 2016-04-08 DIAGNOSIS — I129 Hypertensive chronic kidney disease with stage 1 through stage 4 chronic kidney disease, or unspecified chronic kidney disease: Secondary | ICD-10-CM | POA: Diagnosis not present

## 2016-04-09 DIAGNOSIS — N189 Chronic kidney disease, unspecified: Secondary | ICD-10-CM | POA: Diagnosis not present

## 2016-04-09 DIAGNOSIS — F329 Major depressive disorder, single episode, unspecified: Secondary | ICD-10-CM | POA: Diagnosis not present

## 2016-04-09 DIAGNOSIS — R627 Adult failure to thrive: Secondary | ICD-10-CM | POA: Diagnosis not present

## 2016-04-09 DIAGNOSIS — Z602 Problems related to living alone: Secondary | ICD-10-CM | POA: Diagnosis not present

## 2016-04-09 DIAGNOSIS — Z6837 Body mass index (BMI) 37.0-37.9, adult: Secondary | ICD-10-CM | POA: Diagnosis not present

## 2016-04-09 DIAGNOSIS — M791 Myalgia: Secondary | ICD-10-CM | POA: Diagnosis not present

## 2016-04-09 DIAGNOSIS — M6281 Muscle weakness (generalized): Secondary | ICD-10-CM | POA: Diagnosis not present

## 2016-04-09 DIAGNOSIS — I2510 Atherosclerotic heart disease of native coronary artery without angina pectoris: Secondary | ICD-10-CM | POA: Diagnosis not present

## 2016-04-09 DIAGNOSIS — I129 Hypertensive chronic kidney disease with stage 1 through stage 4 chronic kidney disease, or unspecified chronic kidney disease: Secondary | ICD-10-CM | POA: Diagnosis not present

## 2016-04-13 DIAGNOSIS — R627 Adult failure to thrive: Secondary | ICD-10-CM | POA: Diagnosis not present

## 2016-04-13 DIAGNOSIS — Z602 Problems related to living alone: Secondary | ICD-10-CM | POA: Diagnosis not present

## 2016-04-13 DIAGNOSIS — M791 Myalgia: Secondary | ICD-10-CM | POA: Diagnosis not present

## 2016-04-13 DIAGNOSIS — I2510 Atherosclerotic heart disease of native coronary artery without angina pectoris: Secondary | ICD-10-CM | POA: Diagnosis not present

## 2016-04-13 DIAGNOSIS — M6281 Muscle weakness (generalized): Secondary | ICD-10-CM | POA: Diagnosis not present

## 2016-04-13 DIAGNOSIS — Z6837 Body mass index (BMI) 37.0-37.9, adult: Secondary | ICD-10-CM | POA: Diagnosis not present

## 2016-04-13 DIAGNOSIS — I129 Hypertensive chronic kidney disease with stage 1 through stage 4 chronic kidney disease, or unspecified chronic kidney disease: Secondary | ICD-10-CM | POA: Diagnosis not present

## 2016-04-13 DIAGNOSIS — N189 Chronic kidney disease, unspecified: Secondary | ICD-10-CM | POA: Diagnosis not present

## 2016-04-13 DIAGNOSIS — F329 Major depressive disorder, single episode, unspecified: Secondary | ICD-10-CM | POA: Diagnosis not present

## 2016-04-14 DIAGNOSIS — I129 Hypertensive chronic kidney disease with stage 1 through stage 4 chronic kidney disease, or unspecified chronic kidney disease: Secondary | ICD-10-CM | POA: Diagnosis not present

## 2016-04-14 DIAGNOSIS — Z602 Problems related to living alone: Secondary | ICD-10-CM | POA: Diagnosis not present

## 2016-04-14 DIAGNOSIS — I2510 Atherosclerotic heart disease of native coronary artery without angina pectoris: Secondary | ICD-10-CM | POA: Diagnosis not present

## 2016-04-14 DIAGNOSIS — N189 Chronic kidney disease, unspecified: Secondary | ICD-10-CM | POA: Diagnosis not present

## 2016-04-14 DIAGNOSIS — F329 Major depressive disorder, single episode, unspecified: Secondary | ICD-10-CM | POA: Diagnosis not present

## 2016-04-14 DIAGNOSIS — M791 Myalgia: Secondary | ICD-10-CM | POA: Diagnosis not present

## 2016-04-14 DIAGNOSIS — R627 Adult failure to thrive: Secondary | ICD-10-CM | POA: Diagnosis not present

## 2016-04-14 DIAGNOSIS — M6281 Muscle weakness (generalized): Secondary | ICD-10-CM | POA: Diagnosis not present

## 2016-04-14 DIAGNOSIS — Z6837 Body mass index (BMI) 37.0-37.9, adult: Secondary | ICD-10-CM | POA: Diagnosis not present

## 2016-04-15 DIAGNOSIS — R600 Localized edema: Secondary | ICD-10-CM | POA: Diagnosis not present

## 2016-04-15 DIAGNOSIS — Z79899 Other long term (current) drug therapy: Secondary | ICD-10-CM | POA: Diagnosis not present

## 2016-04-15 DIAGNOSIS — Z86718 Personal history of other venous thrombosis and embolism: Secondary | ICD-10-CM | POA: Diagnosis not present

## 2016-04-15 DIAGNOSIS — Z6837 Body mass index (BMI) 37.0-37.9, adult: Secondary | ICD-10-CM | POA: Diagnosis not present

## 2016-04-15 DIAGNOSIS — Z8673 Personal history of transient ischemic attack (TIA), and cerebral infarction without residual deficits: Secondary | ICD-10-CM | POA: Diagnosis not present

## 2016-04-15 DIAGNOSIS — I4891 Unspecified atrial fibrillation: Secondary | ICD-10-CM | POA: Diagnosis not present

## 2016-04-15 DIAGNOSIS — Z7901 Long term (current) use of anticoagulants: Secondary | ICD-10-CM | POA: Diagnosis not present

## 2016-04-15 DIAGNOSIS — G629 Polyneuropathy, unspecified: Secondary | ICD-10-CM | POA: Diagnosis not present

## 2016-04-16 DIAGNOSIS — F329 Major depressive disorder, single episode, unspecified: Secondary | ICD-10-CM | POA: Diagnosis not present

## 2016-04-16 DIAGNOSIS — I129 Hypertensive chronic kidney disease with stage 1 through stage 4 chronic kidney disease, or unspecified chronic kidney disease: Secondary | ICD-10-CM | POA: Diagnosis not present

## 2016-04-16 DIAGNOSIS — M6281 Muscle weakness (generalized): Secondary | ICD-10-CM | POA: Diagnosis not present

## 2016-04-16 DIAGNOSIS — Z602 Problems related to living alone: Secondary | ICD-10-CM | POA: Diagnosis not present

## 2016-04-16 DIAGNOSIS — M791 Myalgia: Secondary | ICD-10-CM | POA: Diagnosis not present

## 2016-04-16 DIAGNOSIS — Z6837 Body mass index (BMI) 37.0-37.9, adult: Secondary | ICD-10-CM | POA: Diagnosis not present

## 2016-04-16 DIAGNOSIS — N189 Chronic kidney disease, unspecified: Secondary | ICD-10-CM | POA: Diagnosis not present

## 2016-04-16 DIAGNOSIS — I2510 Atherosclerotic heart disease of native coronary artery without angina pectoris: Secondary | ICD-10-CM | POA: Diagnosis not present

## 2016-04-16 DIAGNOSIS — R627 Adult failure to thrive: Secondary | ICD-10-CM | POA: Diagnosis not present

## 2016-04-17 DIAGNOSIS — Z6837 Body mass index (BMI) 37.0-37.9, adult: Secondary | ICD-10-CM | POA: Diagnosis not present

## 2016-04-17 DIAGNOSIS — R627 Adult failure to thrive: Secondary | ICD-10-CM | POA: Diagnosis not present

## 2016-04-17 DIAGNOSIS — N189 Chronic kidney disease, unspecified: Secondary | ICD-10-CM | POA: Diagnosis not present

## 2016-04-17 DIAGNOSIS — M791 Myalgia: Secondary | ICD-10-CM | POA: Diagnosis not present

## 2016-04-17 DIAGNOSIS — I129 Hypertensive chronic kidney disease with stage 1 through stage 4 chronic kidney disease, or unspecified chronic kidney disease: Secondary | ICD-10-CM | POA: Diagnosis not present

## 2016-04-17 DIAGNOSIS — F329 Major depressive disorder, single episode, unspecified: Secondary | ICD-10-CM | POA: Diagnosis not present

## 2016-04-17 DIAGNOSIS — M6281 Muscle weakness (generalized): Secondary | ICD-10-CM | POA: Diagnosis not present

## 2016-04-17 DIAGNOSIS — Z602 Problems related to living alone: Secondary | ICD-10-CM | POA: Diagnosis not present

## 2016-04-17 DIAGNOSIS — I2510 Atherosclerotic heart disease of native coronary artery without angina pectoris: Secondary | ICD-10-CM | POA: Diagnosis not present

## 2016-04-21 DIAGNOSIS — I2510 Atherosclerotic heart disease of native coronary artery without angina pectoris: Secondary | ICD-10-CM | POA: Diagnosis not present

## 2016-04-21 DIAGNOSIS — F329 Major depressive disorder, single episode, unspecified: Secondary | ICD-10-CM | POA: Diagnosis not present

## 2016-04-21 DIAGNOSIS — M6281 Muscle weakness (generalized): Secondary | ICD-10-CM | POA: Diagnosis not present

## 2016-04-21 DIAGNOSIS — M791 Myalgia: Secondary | ICD-10-CM | POA: Diagnosis not present

## 2016-04-21 DIAGNOSIS — I129 Hypertensive chronic kidney disease with stage 1 through stage 4 chronic kidney disease, or unspecified chronic kidney disease: Secondary | ICD-10-CM | POA: Diagnosis not present

## 2016-04-21 DIAGNOSIS — Z602 Problems related to living alone: Secondary | ICD-10-CM | POA: Diagnosis not present

## 2016-04-21 DIAGNOSIS — N189 Chronic kidney disease, unspecified: Secondary | ICD-10-CM | POA: Diagnosis not present

## 2016-04-21 DIAGNOSIS — R627 Adult failure to thrive: Secondary | ICD-10-CM | POA: Diagnosis not present

## 2016-04-21 DIAGNOSIS — Z6837 Body mass index (BMI) 37.0-37.9, adult: Secondary | ICD-10-CM | POA: Diagnosis not present

## 2016-04-22 DIAGNOSIS — N189 Chronic kidney disease, unspecified: Secondary | ICD-10-CM | POA: Diagnosis not present

## 2016-04-22 DIAGNOSIS — F329 Major depressive disorder, single episode, unspecified: Secondary | ICD-10-CM | POA: Diagnosis not present

## 2016-04-22 DIAGNOSIS — R627 Adult failure to thrive: Secondary | ICD-10-CM | POA: Diagnosis not present

## 2016-04-22 DIAGNOSIS — M791 Myalgia: Secondary | ICD-10-CM | POA: Diagnosis not present

## 2016-04-22 DIAGNOSIS — Z602 Problems related to living alone: Secondary | ICD-10-CM | POA: Diagnosis not present

## 2016-04-22 DIAGNOSIS — M6281 Muscle weakness (generalized): Secondary | ICD-10-CM | POA: Diagnosis not present

## 2016-04-22 DIAGNOSIS — I129 Hypertensive chronic kidney disease with stage 1 through stage 4 chronic kidney disease, or unspecified chronic kidney disease: Secondary | ICD-10-CM | POA: Diagnosis not present

## 2016-04-22 DIAGNOSIS — Z6837 Body mass index (BMI) 37.0-37.9, adult: Secondary | ICD-10-CM | POA: Diagnosis not present

## 2016-04-22 DIAGNOSIS — I2510 Atherosclerotic heart disease of native coronary artery without angina pectoris: Secondary | ICD-10-CM | POA: Diagnosis not present

## 2016-04-24 DIAGNOSIS — M791 Myalgia: Secondary | ICD-10-CM | POA: Diagnosis not present

## 2016-04-24 DIAGNOSIS — I2510 Atherosclerotic heart disease of native coronary artery without angina pectoris: Secondary | ICD-10-CM | POA: Diagnosis not present

## 2016-04-24 DIAGNOSIS — I129 Hypertensive chronic kidney disease with stage 1 through stage 4 chronic kidney disease, or unspecified chronic kidney disease: Secondary | ICD-10-CM | POA: Diagnosis not present

## 2016-04-24 DIAGNOSIS — N189 Chronic kidney disease, unspecified: Secondary | ICD-10-CM | POA: Diagnosis not present

## 2016-04-24 DIAGNOSIS — Z6837 Body mass index (BMI) 37.0-37.9, adult: Secondary | ICD-10-CM | POA: Diagnosis not present

## 2016-04-24 DIAGNOSIS — Z602 Problems related to living alone: Secondary | ICD-10-CM | POA: Diagnosis not present

## 2016-04-24 DIAGNOSIS — R627 Adult failure to thrive: Secondary | ICD-10-CM | POA: Diagnosis not present

## 2016-04-24 DIAGNOSIS — F329 Major depressive disorder, single episode, unspecified: Secondary | ICD-10-CM | POA: Diagnosis not present

## 2016-04-24 DIAGNOSIS — M6281 Muscle weakness (generalized): Secondary | ICD-10-CM | POA: Diagnosis not present

## 2016-04-29 DIAGNOSIS — M549 Dorsalgia, unspecified: Secondary | ICD-10-CM | POA: Diagnosis not present

## 2016-05-07 DIAGNOSIS — M1990 Unspecified osteoarthritis, unspecified site: Secondary | ICD-10-CM | POA: Diagnosis not present

## 2016-05-07 DIAGNOSIS — R339 Retention of urine, unspecified: Secondary | ICD-10-CM | POA: Diagnosis not present

## 2016-05-07 DIAGNOSIS — R1032 Left lower quadrant pain: Secondary | ICD-10-CM | POA: Diagnosis not present

## 2016-05-07 DIAGNOSIS — I10 Essential (primary) hypertension: Secondary | ICD-10-CM | POA: Diagnosis not present

## 2016-05-07 DIAGNOSIS — R0602 Shortness of breath: Secondary | ICD-10-CM | POA: Diagnosis not present

## 2016-05-07 DIAGNOSIS — I4891 Unspecified atrial fibrillation: Secondary | ICD-10-CM | POA: Diagnosis not present

## 2016-05-07 DIAGNOSIS — R1031 Right lower quadrant pain: Secondary | ICD-10-CM | POA: Diagnosis not present

## 2016-05-07 DIAGNOSIS — R0609 Other forms of dyspnea: Secondary | ICD-10-CM | POA: Diagnosis not present

## 2016-05-07 DIAGNOSIS — R0600 Dyspnea, unspecified: Secondary | ICD-10-CM | POA: Diagnosis not present

## 2016-05-07 DIAGNOSIS — Z6837 Body mass index (BMI) 37.0-37.9, adult: Secondary | ICD-10-CM | POA: Diagnosis not present

## 2016-05-07 DIAGNOSIS — I480 Paroxysmal atrial fibrillation: Secondary | ICD-10-CM | POA: Diagnosis not present

## 2016-05-07 DIAGNOSIS — E860 Dehydration: Secondary | ICD-10-CM | POA: Diagnosis not present

## 2016-05-07 DIAGNOSIS — M549 Dorsalgia, unspecified: Secondary | ICD-10-CM | POA: Diagnosis not present

## 2016-05-07 DIAGNOSIS — R079 Chest pain, unspecified: Secondary | ICD-10-CM | POA: Diagnosis not present

## 2016-05-07 DIAGNOSIS — R9431 Abnormal electrocardiogram [ECG] [EKG]: Secondary | ICD-10-CM | POA: Diagnosis not present

## 2016-05-07 DIAGNOSIS — E7800 Pure hypercholesterolemia, unspecified: Secondary | ICD-10-CM | POA: Diagnosis not present

## 2016-05-07 DIAGNOSIS — B0229 Other postherpetic nervous system involvement: Secondary | ICD-10-CM | POA: Diagnosis not present

## 2016-05-07 DIAGNOSIS — E785 Hyperlipidemia, unspecified: Secondary | ICD-10-CM | POA: Diagnosis not present

## 2016-05-07 DIAGNOSIS — Z86718 Personal history of other venous thrombosis and embolism: Secondary | ICD-10-CM | POA: Diagnosis not present

## 2016-05-07 DIAGNOSIS — Z79899 Other long term (current) drug therapy: Secondary | ICD-10-CM | POA: Diagnosis not present

## 2016-05-12 DIAGNOSIS — I4891 Unspecified atrial fibrillation: Secondary | ICD-10-CM | POA: Diagnosis not present

## 2016-05-12 DIAGNOSIS — I2510 Atherosclerotic heart disease of native coronary artery without angina pectoris: Secondary | ICD-10-CM | POA: Diagnosis not present

## 2016-05-12 DIAGNOSIS — I129 Hypertensive chronic kidney disease with stage 1 through stage 4 chronic kidney disease, or unspecified chronic kidney disease: Secondary | ICD-10-CM | POA: Diagnosis not present

## 2016-05-12 DIAGNOSIS — N189 Chronic kidney disease, unspecified: Secondary | ICD-10-CM | POA: Diagnosis not present

## 2016-05-12 DIAGNOSIS — Z6837 Body mass index (BMI) 37.0-37.9, adult: Secondary | ICD-10-CM | POA: Diagnosis not present

## 2016-05-12 DIAGNOSIS — F329 Major depressive disorder, single episode, unspecified: Secondary | ICD-10-CM | POA: Diagnosis not present

## 2016-05-12 DIAGNOSIS — M1991 Primary osteoarthritis, unspecified site: Secondary | ICD-10-CM | POA: Diagnosis not present

## 2016-05-12 DIAGNOSIS — Z602 Problems related to living alone: Secondary | ICD-10-CM | POA: Diagnosis not present

## 2016-05-12 DIAGNOSIS — M791 Myalgia: Secondary | ICD-10-CM | POA: Diagnosis not present

## 2016-05-15 DIAGNOSIS — I2510 Atherosclerotic heart disease of native coronary artery without angina pectoris: Secondary | ICD-10-CM | POA: Diagnosis not present

## 2016-05-15 DIAGNOSIS — M1991 Primary osteoarthritis, unspecified site: Secondary | ICD-10-CM | POA: Diagnosis not present

## 2016-05-15 DIAGNOSIS — M791 Myalgia: Secondary | ICD-10-CM | POA: Diagnosis not present

## 2016-05-15 DIAGNOSIS — N189 Chronic kidney disease, unspecified: Secondary | ICD-10-CM | POA: Diagnosis not present

## 2016-05-15 DIAGNOSIS — I129 Hypertensive chronic kidney disease with stage 1 through stage 4 chronic kidney disease, or unspecified chronic kidney disease: Secondary | ICD-10-CM | POA: Diagnosis not present

## 2016-05-15 DIAGNOSIS — Z6837 Body mass index (BMI) 37.0-37.9, adult: Secondary | ICD-10-CM | POA: Diagnosis not present

## 2016-05-15 DIAGNOSIS — Z602 Problems related to living alone: Secondary | ICD-10-CM | POA: Diagnosis not present

## 2016-05-15 DIAGNOSIS — F329 Major depressive disorder, single episode, unspecified: Secondary | ICD-10-CM | POA: Diagnosis not present

## 2016-05-15 DIAGNOSIS — I4891 Unspecified atrial fibrillation: Secondary | ICD-10-CM | POA: Diagnosis not present

## 2016-05-18 DIAGNOSIS — I129 Hypertensive chronic kidney disease with stage 1 through stage 4 chronic kidney disease, or unspecified chronic kidney disease: Secondary | ICD-10-CM | POA: Diagnosis not present

## 2016-05-18 DIAGNOSIS — Z602 Problems related to living alone: Secondary | ICD-10-CM | POA: Diagnosis not present

## 2016-05-18 DIAGNOSIS — Z6837 Body mass index (BMI) 37.0-37.9, adult: Secondary | ICD-10-CM | POA: Diagnosis not present

## 2016-05-18 DIAGNOSIS — N189 Chronic kidney disease, unspecified: Secondary | ICD-10-CM | POA: Diagnosis not present

## 2016-05-18 DIAGNOSIS — M791 Myalgia: Secondary | ICD-10-CM | POA: Diagnosis not present

## 2016-05-18 DIAGNOSIS — F329 Major depressive disorder, single episode, unspecified: Secondary | ICD-10-CM | POA: Diagnosis not present

## 2016-05-18 DIAGNOSIS — M1991 Primary osteoarthritis, unspecified site: Secondary | ICD-10-CM | POA: Diagnosis not present

## 2016-05-18 DIAGNOSIS — I4891 Unspecified atrial fibrillation: Secondary | ICD-10-CM | POA: Diagnosis not present

## 2016-05-18 DIAGNOSIS — I2510 Atherosclerotic heart disease of native coronary artery without angina pectoris: Secondary | ICD-10-CM | POA: Diagnosis not present

## 2016-05-19 DIAGNOSIS — M791 Myalgia: Secondary | ICD-10-CM | POA: Diagnosis not present

## 2016-05-19 DIAGNOSIS — Z602 Problems related to living alone: Secondary | ICD-10-CM | POA: Diagnosis not present

## 2016-05-19 DIAGNOSIS — Z6837 Body mass index (BMI) 37.0-37.9, adult: Secondary | ICD-10-CM | POA: Diagnosis not present

## 2016-05-19 DIAGNOSIS — I2510 Atherosclerotic heart disease of native coronary artery without angina pectoris: Secondary | ICD-10-CM | POA: Diagnosis not present

## 2016-05-19 DIAGNOSIS — M1991 Primary osteoarthritis, unspecified site: Secondary | ICD-10-CM | POA: Diagnosis not present

## 2016-05-19 DIAGNOSIS — I4891 Unspecified atrial fibrillation: Secondary | ICD-10-CM | POA: Diagnosis not present

## 2016-05-19 DIAGNOSIS — N189 Chronic kidney disease, unspecified: Secondary | ICD-10-CM | POA: Diagnosis not present

## 2016-05-19 DIAGNOSIS — I129 Hypertensive chronic kidney disease with stage 1 through stage 4 chronic kidney disease, or unspecified chronic kidney disease: Secondary | ICD-10-CM | POA: Diagnosis not present

## 2016-05-19 DIAGNOSIS — F329 Major depressive disorder, single episode, unspecified: Secondary | ICD-10-CM | POA: Diagnosis not present

## 2016-05-20 DIAGNOSIS — I2510 Atherosclerotic heart disease of native coronary artery without angina pectoris: Secondary | ICD-10-CM | POA: Diagnosis not present

## 2016-05-20 DIAGNOSIS — M1991 Primary osteoarthritis, unspecified site: Secondary | ICD-10-CM | POA: Diagnosis not present

## 2016-05-20 DIAGNOSIS — Z602 Problems related to living alone: Secondary | ICD-10-CM | POA: Diagnosis not present

## 2016-05-20 DIAGNOSIS — Z6837 Body mass index (BMI) 37.0-37.9, adult: Secondary | ICD-10-CM | POA: Diagnosis not present

## 2016-05-20 DIAGNOSIS — I4891 Unspecified atrial fibrillation: Secondary | ICD-10-CM | POA: Diagnosis not present

## 2016-05-20 DIAGNOSIS — N189 Chronic kidney disease, unspecified: Secondary | ICD-10-CM | POA: Diagnosis not present

## 2016-05-20 DIAGNOSIS — M791 Myalgia: Secondary | ICD-10-CM | POA: Diagnosis not present

## 2016-05-20 DIAGNOSIS — F329 Major depressive disorder, single episode, unspecified: Secondary | ICD-10-CM | POA: Diagnosis not present

## 2016-05-20 DIAGNOSIS — I129 Hypertensive chronic kidney disease with stage 1 through stage 4 chronic kidney disease, or unspecified chronic kidney disease: Secondary | ICD-10-CM | POA: Diagnosis not present

## 2016-05-21 DIAGNOSIS — M1991 Primary osteoarthritis, unspecified site: Secondary | ICD-10-CM | POA: Diagnosis not present

## 2016-05-21 DIAGNOSIS — I4891 Unspecified atrial fibrillation: Secondary | ICD-10-CM | POA: Diagnosis not present

## 2016-05-21 DIAGNOSIS — Z602 Problems related to living alone: Secondary | ICD-10-CM | POA: Diagnosis not present

## 2016-05-21 DIAGNOSIS — F329 Major depressive disorder, single episode, unspecified: Secondary | ICD-10-CM | POA: Diagnosis not present

## 2016-05-21 DIAGNOSIS — I129 Hypertensive chronic kidney disease with stage 1 through stage 4 chronic kidney disease, or unspecified chronic kidney disease: Secondary | ICD-10-CM | POA: Diagnosis not present

## 2016-05-21 DIAGNOSIS — N189 Chronic kidney disease, unspecified: Secondary | ICD-10-CM | POA: Diagnosis not present

## 2016-05-21 DIAGNOSIS — I2510 Atherosclerotic heart disease of native coronary artery without angina pectoris: Secondary | ICD-10-CM | POA: Diagnosis not present

## 2016-05-21 DIAGNOSIS — Z6837 Body mass index (BMI) 37.0-37.9, adult: Secondary | ICD-10-CM | POA: Diagnosis not present

## 2016-05-21 DIAGNOSIS — M791 Myalgia: Secondary | ICD-10-CM | POA: Diagnosis not present

## 2016-05-22 DIAGNOSIS — I4891 Unspecified atrial fibrillation: Secondary | ICD-10-CM | POA: Diagnosis not present

## 2016-05-22 DIAGNOSIS — Z6837 Body mass index (BMI) 37.0-37.9, adult: Secondary | ICD-10-CM | POA: Diagnosis not present

## 2016-05-22 DIAGNOSIS — I2510 Atherosclerotic heart disease of native coronary artery without angina pectoris: Secondary | ICD-10-CM | POA: Diagnosis not present

## 2016-05-22 DIAGNOSIS — M1991 Primary osteoarthritis, unspecified site: Secondary | ICD-10-CM | POA: Diagnosis not present

## 2016-05-22 DIAGNOSIS — N189 Chronic kidney disease, unspecified: Secondary | ICD-10-CM | POA: Diagnosis not present

## 2016-05-22 DIAGNOSIS — F329 Major depressive disorder, single episode, unspecified: Secondary | ICD-10-CM | POA: Diagnosis not present

## 2016-05-22 DIAGNOSIS — M791 Myalgia: Secondary | ICD-10-CM | POA: Diagnosis not present

## 2016-05-22 DIAGNOSIS — I129 Hypertensive chronic kidney disease with stage 1 through stage 4 chronic kidney disease, or unspecified chronic kidney disease: Secondary | ICD-10-CM | POA: Diagnosis not present

## 2016-05-22 DIAGNOSIS — Z602 Problems related to living alone: Secondary | ICD-10-CM | POA: Diagnosis not present

## 2016-05-25 DIAGNOSIS — N189 Chronic kidney disease, unspecified: Secondary | ICD-10-CM | POA: Diagnosis not present

## 2016-05-25 DIAGNOSIS — F329 Major depressive disorder, single episode, unspecified: Secondary | ICD-10-CM | POA: Diagnosis not present

## 2016-05-25 DIAGNOSIS — I2510 Atherosclerotic heart disease of native coronary artery without angina pectoris: Secondary | ICD-10-CM | POA: Diagnosis not present

## 2016-05-25 DIAGNOSIS — M1991 Primary osteoarthritis, unspecified site: Secondary | ICD-10-CM | POA: Diagnosis not present

## 2016-05-25 DIAGNOSIS — Z602 Problems related to living alone: Secondary | ICD-10-CM | POA: Diagnosis not present

## 2016-05-25 DIAGNOSIS — I4891 Unspecified atrial fibrillation: Secondary | ICD-10-CM | POA: Diagnosis not present

## 2016-05-25 DIAGNOSIS — Z6837 Body mass index (BMI) 37.0-37.9, adult: Secondary | ICD-10-CM | POA: Diagnosis not present

## 2016-05-25 DIAGNOSIS — I129 Hypertensive chronic kidney disease with stage 1 through stage 4 chronic kidney disease, or unspecified chronic kidney disease: Secondary | ICD-10-CM | POA: Diagnosis not present

## 2016-05-25 DIAGNOSIS — M791 Myalgia: Secondary | ICD-10-CM | POA: Diagnosis not present

## 2016-05-26 DIAGNOSIS — N189 Chronic kidney disease, unspecified: Secondary | ICD-10-CM | POA: Diagnosis not present

## 2016-05-26 DIAGNOSIS — Z6837 Body mass index (BMI) 37.0-37.9, adult: Secondary | ICD-10-CM | POA: Diagnosis not present

## 2016-05-26 DIAGNOSIS — I4891 Unspecified atrial fibrillation: Secondary | ICD-10-CM | POA: Diagnosis not present

## 2016-05-26 DIAGNOSIS — I129 Hypertensive chronic kidney disease with stage 1 through stage 4 chronic kidney disease, or unspecified chronic kidney disease: Secondary | ICD-10-CM | POA: Diagnosis not present

## 2016-05-26 DIAGNOSIS — I2510 Atherosclerotic heart disease of native coronary artery without angina pectoris: Secondary | ICD-10-CM | POA: Diagnosis not present

## 2016-05-26 DIAGNOSIS — F329 Major depressive disorder, single episode, unspecified: Secondary | ICD-10-CM | POA: Diagnosis not present

## 2016-05-26 DIAGNOSIS — Z602 Problems related to living alone: Secondary | ICD-10-CM | POA: Diagnosis not present

## 2016-05-26 DIAGNOSIS — M1991 Primary osteoarthritis, unspecified site: Secondary | ICD-10-CM | POA: Diagnosis not present

## 2016-05-26 DIAGNOSIS — M791 Myalgia: Secondary | ICD-10-CM | POA: Diagnosis not present

## 2016-05-27 DIAGNOSIS — I2510 Atherosclerotic heart disease of native coronary artery without angina pectoris: Secondary | ICD-10-CM | POA: Diagnosis not present

## 2016-05-27 DIAGNOSIS — I4891 Unspecified atrial fibrillation: Secondary | ICD-10-CM | POA: Diagnosis not present

## 2016-05-27 DIAGNOSIS — M791 Myalgia: Secondary | ICD-10-CM | POA: Diagnosis not present

## 2016-05-27 DIAGNOSIS — Z6837 Body mass index (BMI) 37.0-37.9, adult: Secondary | ICD-10-CM | POA: Diagnosis not present

## 2016-05-27 DIAGNOSIS — F329 Major depressive disorder, single episode, unspecified: Secondary | ICD-10-CM | POA: Diagnosis not present

## 2016-05-27 DIAGNOSIS — M1991 Primary osteoarthritis, unspecified site: Secondary | ICD-10-CM | POA: Diagnosis not present

## 2016-05-27 DIAGNOSIS — N189 Chronic kidney disease, unspecified: Secondary | ICD-10-CM | POA: Diagnosis not present

## 2016-05-27 DIAGNOSIS — Z602 Problems related to living alone: Secondary | ICD-10-CM | POA: Diagnosis not present

## 2016-05-27 DIAGNOSIS — I129 Hypertensive chronic kidney disease with stage 1 through stage 4 chronic kidney disease, or unspecified chronic kidney disease: Secondary | ICD-10-CM | POA: Diagnosis not present

## 2016-05-28 DIAGNOSIS — M1991 Primary osteoarthritis, unspecified site: Secondary | ICD-10-CM | POA: Diagnosis not present

## 2016-05-28 DIAGNOSIS — N189 Chronic kidney disease, unspecified: Secondary | ICD-10-CM | POA: Diagnosis not present

## 2016-05-28 DIAGNOSIS — Z602 Problems related to living alone: Secondary | ICD-10-CM | POA: Diagnosis not present

## 2016-05-28 DIAGNOSIS — I2510 Atherosclerotic heart disease of native coronary artery without angina pectoris: Secondary | ICD-10-CM | POA: Diagnosis not present

## 2016-05-28 DIAGNOSIS — M791 Myalgia: Secondary | ICD-10-CM | POA: Diagnosis not present

## 2016-05-28 DIAGNOSIS — I129 Hypertensive chronic kidney disease with stage 1 through stage 4 chronic kidney disease, or unspecified chronic kidney disease: Secondary | ICD-10-CM | POA: Diagnosis not present

## 2016-05-28 DIAGNOSIS — Z6837 Body mass index (BMI) 37.0-37.9, adult: Secondary | ICD-10-CM | POA: Diagnosis not present

## 2016-05-28 DIAGNOSIS — F329 Major depressive disorder, single episode, unspecified: Secondary | ICD-10-CM | POA: Diagnosis not present

## 2016-05-28 DIAGNOSIS — I4891 Unspecified atrial fibrillation: Secondary | ICD-10-CM | POA: Diagnosis not present

## 2016-06-01 DIAGNOSIS — F329 Major depressive disorder, single episode, unspecified: Secondary | ICD-10-CM | POA: Diagnosis not present

## 2016-06-01 DIAGNOSIS — M1991 Primary osteoarthritis, unspecified site: Secondary | ICD-10-CM | POA: Diagnosis not present

## 2016-06-01 DIAGNOSIS — N189 Chronic kidney disease, unspecified: Secondary | ICD-10-CM | POA: Diagnosis not present

## 2016-06-01 DIAGNOSIS — Z602 Problems related to living alone: Secondary | ICD-10-CM | POA: Diagnosis not present

## 2016-06-01 DIAGNOSIS — I4891 Unspecified atrial fibrillation: Secondary | ICD-10-CM | POA: Diagnosis not present

## 2016-06-01 DIAGNOSIS — I129 Hypertensive chronic kidney disease with stage 1 through stage 4 chronic kidney disease, or unspecified chronic kidney disease: Secondary | ICD-10-CM | POA: Diagnosis not present

## 2016-06-01 DIAGNOSIS — M791 Myalgia: Secondary | ICD-10-CM | POA: Diagnosis not present

## 2016-06-01 DIAGNOSIS — I2510 Atherosclerotic heart disease of native coronary artery without angina pectoris: Secondary | ICD-10-CM | POA: Diagnosis not present

## 2016-06-01 DIAGNOSIS — Z6837 Body mass index (BMI) 37.0-37.9, adult: Secondary | ICD-10-CM | POA: Diagnosis not present

## 2016-06-04 DIAGNOSIS — Z602 Problems related to living alone: Secondary | ICD-10-CM | POA: Diagnosis not present

## 2016-06-04 DIAGNOSIS — M1991 Primary osteoarthritis, unspecified site: Secondary | ICD-10-CM | POA: Diagnosis not present

## 2016-06-04 DIAGNOSIS — Z6837 Body mass index (BMI) 37.0-37.9, adult: Secondary | ICD-10-CM | POA: Diagnosis not present

## 2016-06-04 DIAGNOSIS — N189 Chronic kidney disease, unspecified: Secondary | ICD-10-CM | POA: Diagnosis not present

## 2016-06-04 DIAGNOSIS — F329 Major depressive disorder, single episode, unspecified: Secondary | ICD-10-CM | POA: Diagnosis not present

## 2016-06-04 DIAGNOSIS — I4891 Unspecified atrial fibrillation: Secondary | ICD-10-CM | POA: Diagnosis not present

## 2016-06-04 DIAGNOSIS — M791 Myalgia: Secondary | ICD-10-CM | POA: Diagnosis not present

## 2016-06-04 DIAGNOSIS — I2510 Atherosclerotic heart disease of native coronary artery without angina pectoris: Secondary | ICD-10-CM | POA: Diagnosis not present

## 2016-06-04 DIAGNOSIS — I129 Hypertensive chronic kidney disease with stage 1 through stage 4 chronic kidney disease, or unspecified chronic kidney disease: Secondary | ICD-10-CM | POA: Diagnosis not present

## 2016-06-09 DIAGNOSIS — M791 Myalgia: Secondary | ICD-10-CM | POA: Diagnosis not present

## 2016-06-09 DIAGNOSIS — E785 Hyperlipidemia, unspecified: Secondary | ICD-10-CM | POA: Diagnosis not present

## 2016-06-09 DIAGNOSIS — N189 Chronic kidney disease, unspecified: Secondary | ICD-10-CM | POA: Diagnosis not present

## 2016-06-09 DIAGNOSIS — I2510 Atherosclerotic heart disease of native coronary artery without angina pectoris: Secondary | ICD-10-CM | POA: Diagnosis not present

## 2016-06-09 DIAGNOSIS — Z602 Problems related to living alone: Secondary | ICD-10-CM | POA: Diagnosis not present

## 2016-06-09 DIAGNOSIS — I4891 Unspecified atrial fibrillation: Secondary | ICD-10-CM | POA: Diagnosis not present

## 2016-06-09 DIAGNOSIS — M1991 Primary osteoarthritis, unspecified site: Secondary | ICD-10-CM | POA: Diagnosis not present

## 2016-06-09 DIAGNOSIS — F329 Major depressive disorder, single episode, unspecified: Secondary | ICD-10-CM | POA: Diagnosis not present

## 2016-06-09 DIAGNOSIS — Z6837 Body mass index (BMI) 37.0-37.9, adult: Secondary | ICD-10-CM | POA: Diagnosis not present

## 2016-06-09 DIAGNOSIS — I10 Essential (primary) hypertension: Secondary | ICD-10-CM | POA: Diagnosis not present

## 2016-06-09 DIAGNOSIS — Z79899 Other long term (current) drug therapy: Secondary | ICD-10-CM | POA: Diagnosis not present

## 2016-06-09 DIAGNOSIS — I129 Hypertensive chronic kidney disease with stage 1 through stage 4 chronic kidney disease, or unspecified chronic kidney disease: Secondary | ICD-10-CM | POA: Diagnosis not present

## 2016-06-16 DIAGNOSIS — M791 Myalgia: Secondary | ICD-10-CM | POA: Diagnosis not present

## 2016-06-16 DIAGNOSIS — M1991 Primary osteoarthritis, unspecified site: Secondary | ICD-10-CM | POA: Diagnosis not present

## 2016-06-16 DIAGNOSIS — Z6837 Body mass index (BMI) 37.0-37.9, adult: Secondary | ICD-10-CM | POA: Diagnosis not present

## 2016-06-16 DIAGNOSIS — N189 Chronic kidney disease, unspecified: Secondary | ICD-10-CM | POA: Diagnosis not present

## 2016-06-16 DIAGNOSIS — I129 Hypertensive chronic kidney disease with stage 1 through stage 4 chronic kidney disease, or unspecified chronic kidney disease: Secondary | ICD-10-CM | POA: Diagnosis not present

## 2016-06-16 DIAGNOSIS — Z602 Problems related to living alone: Secondary | ICD-10-CM | POA: Diagnosis not present

## 2016-06-16 DIAGNOSIS — I2510 Atherosclerotic heart disease of native coronary artery without angina pectoris: Secondary | ICD-10-CM | POA: Diagnosis not present

## 2016-06-16 DIAGNOSIS — F329 Major depressive disorder, single episode, unspecified: Secondary | ICD-10-CM | POA: Diagnosis not present

## 2016-06-16 DIAGNOSIS — I4891 Unspecified atrial fibrillation: Secondary | ICD-10-CM | POA: Diagnosis not present

## 2016-07-02 ENCOUNTER — Ambulatory Visit (INDEPENDENT_AMBULATORY_CARE_PROVIDER_SITE_OTHER): Payer: Commercial Managed Care - HMO | Admitting: Sports Medicine

## 2016-07-02 ENCOUNTER — Encounter: Payer: Self-pay | Admitting: Sports Medicine

## 2016-07-02 DIAGNOSIS — M79672 Pain in left foot: Secondary | ICD-10-CM | POA: Diagnosis not present

## 2016-07-02 DIAGNOSIS — B351 Tinea unguium: Secondary | ICD-10-CM

## 2016-07-02 DIAGNOSIS — M2040 Other hammer toe(s) (acquired), unspecified foot: Secondary | ICD-10-CM

## 2016-07-02 DIAGNOSIS — L84 Corns and callosities: Secondary | ICD-10-CM | POA: Diagnosis not present

## 2016-07-02 DIAGNOSIS — M79671 Pain in right foot: Secondary | ICD-10-CM

## 2016-07-02 DIAGNOSIS — M21619 Bunion of unspecified foot: Secondary | ICD-10-CM

## 2016-07-02 NOTE — Progress Notes (Signed)
Patient ID: Connie Baker, female   DOB: Nov 18, 1929, 82 y.o.   MRN: [id number] Subjective: Connie Baker is a 82 y.o. female patient seen today in office with complaint of right third toe corn and painful thickened and elongated toenails; unable to trim. Patient denies history of Diabetes, Neuropathy, or known Vascular disease, previous history of embolus lower extremity. Patient has no other pedal complaints at this time.   Patient Active Problem List   Diagnosis Date Noted  . Embolus to lower extremity ([place]) 06/26/2011    Current Outpatient Prescriptions on File Prior to Visit  Medication Sig Dispense Refill  . B Complex Vitamins (VITAMIN-B COMPLEX) TABS Take by mouth.      . Cholecalciferol (VITAMIN D PO) Take by mouth.    . furosemide (LASIX) 20 MG tablet Take 20 mg by mouth.    . IRON PO Take by mouth.    . metoprolol tartrate (LOPRESSOR) 25 MG tablet Take 25 mg by mouth 2 (two) times daily. One and one half tablets two time daily     . Multiple Vitamins-Minerals (MULTI FOR HER PO) Take by mouth.    . Omega-3 Fatty Acids (FISH OIL PO) Take by mouth daily.      . pantoprazole (PROTONIX) 40 MG tablet Take 40 mg by mouth daily.      . pregabalin (LYRICA) 50 MG capsule Take 50 mg by mouth daily.    . rosuvastatin (CRESTOR) 5 MG tablet Take 5 mg by mouth daily.      [name] warfarin (COUMADIN) 1 MG tablet Take 1 mg by mouth daily.     No current facility-administered medications on file prior to visit.    Allergies  Allergen Reactions  . Sulfa Antibiotics     Objective: Physical Exam  General: Well developed, nourished, no acute distress, awake, alert and oriented x 3  Vascular: Dorsalis pedis artery 1/4 bilateral, Posterior tibial artery 1/4 bilateral, skin temperature warm to warm proximal to distal bilateral lower extremities, + varicosities, Scant pedal hair present bilateral.  Neurological: Gross sensation present via light touch bilateral.   Dermatological: Skin is warm,  dry, and supple bilateral, Nails 1-10 are tender, long, thick, and discolored with mild subungal debris, no webspace macerations present bilateral, no open lesions present bilateral, right dorsal third toe corn present. No signs of infection bilateral.  Musculoskeletal: Asymptomatic bunion and hammertoe boney deformities noted bilateral. Muscular strength within normal limits without painon range of motion. No pain with calf compression bilateral.  Assessment and Plan:  Problem List Items Addressed This Visit    None    Visit Diagnoses    Onychomycosis    -  Primary    Corns and callosities        Bunion        Hammertoe, unspecified laterality        Foot pain, bilateral           -Examined patient.  -Discussed treatment options for painful mycotic nails and corn. -Mechanically debrided right third toe corn using sterile chisel blade and reduced mycotic nails with sterile nail nipper and dremel nail file without incident. -Gave toe pad to wear right third toe as instructed -Recommend good supportive shoes for foot type -Patient to return in 3 months for follow up evaluation or sooner if symptoms worsen.  Landis Martins, DPM

## 2016-07-16 DIAGNOSIS — R0602 Shortness of breath: Secondary | ICD-10-CM | POA: Diagnosis not present

## 2016-07-16 DIAGNOSIS — E039 Hypothyroidism, unspecified: Secondary | ICD-10-CM | POA: Diagnosis not present

## 2016-07-16 DIAGNOSIS — I2510 Atherosclerotic heart disease of native coronary artery without angina pectoris: Secondary | ICD-10-CM | POA: Diagnosis not present

## 2016-07-16 DIAGNOSIS — E559 Vitamin D deficiency, unspecified: Secondary | ICD-10-CM | POA: Diagnosis not present

## 2016-07-16 DIAGNOSIS — E785 Hyperlipidemia, unspecified: Secondary | ICD-10-CM | POA: Diagnosis not present

## 2016-07-16 DIAGNOSIS — I10 Essential (primary) hypertension: Secondary | ICD-10-CM | POA: Diagnosis not present

## 2016-07-16 DIAGNOSIS — I4891 Unspecified atrial fibrillation: Secondary | ICD-10-CM | POA: Diagnosis not present

## 2016-07-16 DIAGNOSIS — K219 Gastro-esophageal reflux disease without esophagitis: Secondary | ICD-10-CM | POA: Diagnosis not present

## 2016-07-16 DIAGNOSIS — G629 Polyneuropathy, unspecified: Secondary | ICD-10-CM | POA: Diagnosis not present

## 2016-10-01 ENCOUNTER — Ambulatory Visit: Payer: Commercial Managed Care - HMO | Admitting: Sports Medicine

## 2016-10-02 DIAGNOSIS — Z23 Encounter for immunization: Secondary | ICD-10-CM | POA: Diagnosis not present

## 2016-11-17 DIAGNOSIS — E039 Hypothyroidism, unspecified: Secondary | ICD-10-CM | POA: Diagnosis not present

## 2016-11-17 DIAGNOSIS — G629 Polyneuropathy, unspecified: Secondary | ICD-10-CM | POA: Diagnosis not present

## 2016-11-17 DIAGNOSIS — I2510 Atherosclerotic heart disease of native coronary artery without angina pectoris: Secondary | ICD-10-CM | POA: Diagnosis not present

## 2016-11-17 DIAGNOSIS — N289 Disorder of kidney and ureter, unspecified: Secondary | ICD-10-CM | POA: Diagnosis not present

## 2016-11-17 DIAGNOSIS — K219 Gastro-esophageal reflux disease without esophagitis: Secondary | ICD-10-CM | POA: Diagnosis not present

## 2016-11-17 DIAGNOSIS — I10 Essential (primary) hypertension: Secondary | ICD-10-CM | POA: Diagnosis not present

## 2016-11-17 DIAGNOSIS — E785 Hyperlipidemia, unspecified: Secondary | ICD-10-CM | POA: Diagnosis not present

## 2016-11-17 DIAGNOSIS — Z6839 Body mass index (BMI) 39.0-39.9, adult: Secondary | ICD-10-CM | POA: Diagnosis not present

## 2016-11-17 DIAGNOSIS — E559 Vitamin D deficiency, unspecified: Secondary | ICD-10-CM | POA: Diagnosis not present

## 2016-11-17 DIAGNOSIS — Z79899 Other long term (current) drug therapy: Secondary | ICD-10-CM | POA: Diagnosis not present

## 2017-03-18 DIAGNOSIS — E559 Vitamin D deficiency, unspecified: Secondary | ICD-10-CM | POA: Diagnosis not present

## 2017-03-18 DIAGNOSIS — E785 Hyperlipidemia, unspecified: Secondary | ICD-10-CM | POA: Diagnosis not present

## 2017-03-18 DIAGNOSIS — I2510 Atherosclerotic heart disease of native coronary artery without angina pectoris: Secondary | ICD-10-CM | POA: Diagnosis not present

## 2017-03-18 DIAGNOSIS — E039 Hypothyroidism, unspecified: Secondary | ICD-10-CM | POA: Diagnosis not present

## 2017-03-18 DIAGNOSIS — I4891 Unspecified atrial fibrillation: Secondary | ICD-10-CM | POA: Diagnosis not present

## 2017-03-18 DIAGNOSIS — I10 Essential (primary) hypertension: Secondary | ICD-10-CM | POA: Diagnosis not present

## 2017-03-18 DIAGNOSIS — Z6839 Body mass index (BMI) 39.0-39.9, adult: Secondary | ICD-10-CM | POA: Diagnosis not present

## 2017-03-18 DIAGNOSIS — Z139 Encounter for screening, unspecified: Secondary | ICD-10-CM | POA: Diagnosis not present

## 2017-03-18 DIAGNOSIS — Z79899 Other long term (current) drug therapy: Secondary | ICD-10-CM | POA: Diagnosis not present

## 2017-04-21 DIAGNOSIS — Z79899 Other long term (current) drug therapy: Secondary | ICD-10-CM | POA: Diagnosis not present

## 2017-04-21 DIAGNOSIS — Z6839 Body mass index (BMI) 39.0-39.9, adult: Secondary | ICD-10-CM | POA: Diagnosis not present

## 2017-04-21 DIAGNOSIS — N390 Urinary tract infection, site not specified: Secondary | ICD-10-CM | POA: Diagnosis not present

## 2017-05-04 DIAGNOSIS — Z6839 Body mass index (BMI) 39.0-39.9, adult: Secondary | ICD-10-CM | POA: Diagnosis not present

## 2017-05-04 DIAGNOSIS — R29898 Other symptoms and signs involving the musculoskeletal system: Secondary | ICD-10-CM | POA: Diagnosis not present

## 2017-05-04 DIAGNOSIS — E669 Obesity, unspecified: Secondary | ICD-10-CM | POA: Diagnosis not present

## 2017-05-04 DIAGNOSIS — Z79899 Other long term (current) drug therapy: Secondary | ICD-10-CM | POA: Diagnosis not present

## 2017-05-04 DIAGNOSIS — B0229 Other postherpetic nervous system involvement: Secondary | ICD-10-CM | POA: Diagnosis not present

## 2017-05-12 ENCOUNTER — Encounter: Payer: Self-pay | Admitting: Neurology

## 2017-07-15 DIAGNOSIS — Z139 Encounter for screening, unspecified: Secondary | ICD-10-CM | POA: Diagnosis not present

## 2017-07-15 DIAGNOSIS — E785 Hyperlipidemia, unspecified: Secondary | ICD-10-CM | POA: Diagnosis not present

## 2017-07-15 DIAGNOSIS — I10 Essential (primary) hypertension: Secondary | ICD-10-CM | POA: Diagnosis not present

## 2017-07-15 DIAGNOSIS — E559 Vitamin D deficiency, unspecified: Secondary | ICD-10-CM | POA: Diagnosis not present

## 2017-07-15 DIAGNOSIS — I4891 Unspecified atrial fibrillation: Secondary | ICD-10-CM | POA: Diagnosis not present

## 2017-07-15 DIAGNOSIS — Z79899 Other long term (current) drug therapy: Secondary | ICD-10-CM | POA: Diagnosis not present

## 2017-07-15 DIAGNOSIS — I2510 Atherosclerotic heart disease of native coronary artery without angina pectoris: Secondary | ICD-10-CM | POA: Diagnosis not present

## 2017-07-15 DIAGNOSIS — Z6839 Body mass index (BMI) 39.0-39.9, adult: Secondary | ICD-10-CM | POA: Diagnosis not present

## 2017-07-15 DIAGNOSIS — E039 Hypothyroidism, unspecified: Secondary | ICD-10-CM | POA: Diagnosis not present

## 2017-08-13 ENCOUNTER — Other Ambulatory Visit (INDEPENDENT_AMBULATORY_CARE_PROVIDER_SITE_OTHER): Payer: Medicare HMO

## 2017-08-13 ENCOUNTER — Telehealth: Payer: Self-pay | Admitting: *Deleted

## 2017-08-13 ENCOUNTER — Ambulatory Visit (INDEPENDENT_AMBULATORY_CARE_PROVIDER_SITE_OTHER): Payer: Medicare HMO | Admitting: Neurology

## 2017-08-13 ENCOUNTER — Telehealth: Payer: Self-pay | Admitting: Neurology

## 2017-08-13 ENCOUNTER — Encounter: Payer: Self-pay | Admitting: Neurology

## 2017-08-13 VITALS — BP 108/70 | HR 102 | Ht 62.0 in | Wt 206.0 lb

## 2017-08-13 DIAGNOSIS — R413 Other amnesia: Secondary | ICD-10-CM

## 2017-08-13 DIAGNOSIS — B0229 Other postherpetic nervous system involvement: Secondary | ICD-10-CM

## 2017-08-13 LAB — VITAMIN B12: Vitamin B-12: 520 pg/mL (ref 211–911)

## 2017-08-13 MED ORDER — GABAPENTIN 100 MG PO CAPS
200.0000 mg | ORAL_CAPSULE | Freq: Three times a day (TID) | ORAL | 5 refills | Status: DC
Start: 2017-08-13 — End: 2017-08-17

## 2017-08-13 MED ORDER — LIDOCAINE 5 % EX PTCH: 1.0000 | MEDICATED_PATCH | CUTANEOUS | 5 refills | Status: DC

## 2017-08-13 NOTE — Telephone Encounter (Signed)
Patient's daughter called needing to speak with you regarding her mother's Gabapentin and the wrong MG? Please Call. Thanks

## 2017-08-13 NOTE — Progress Notes (Signed)
[facility] Clinic Note - Initial Visit   Date: 08/13/17  [name] MRN: [id number] DOB: 1929-07-21   Dear Dr. Megan Salon:  Thank you for your kind referral of [name] for consultation of post-herpetic neuralgia. Although her history is well known to you, please allow Connie Baker to reiterate it for the purpose of our medical record. The patient was accompanied to the clinic by daughter who also provides collateral information.     History of Present Illness: Connie Baker is a 83 y.o. right-handed Caucasian female with hypothyroidism, GERD, hyperlipidemia, atrial fibrillation on Xeralto, stroke (residual visual field deficits) presenting for evaluation of post-herpetic neuralgia.    In 2012, she had shingles involving the right flank and abdomen and has had residual pain over this region.  She has burning, stabbing, pain which is intermittent.It is worse over the right breast and she has shocking pain over the posterior thoraxi.   She gets some relief with resting and not moving.  She often sits in her recliner and supports the area with right hand.  Her bra can aggravate her pain.  She has tried Lyrica 50mg  TID, but this was discontinued due to daytime sedation and expense.  She was transitioned to gabapentin 100mg  TID, which helps some and does not make her sleepy.  She lives with her daughter who helps manage her medications.  Her son is POA who manages finances.  She is able to perform all ADLs herself.   Past Medical History:  Diagnosis Date  . Atrial fibrillation ([place])   . CAD (coronary artery disease)   . Cancer [facility])    uterine cancer  . H/O: GI bleed   . History of shingles   . Hyperlipidemia   . Hypertension   . Osteoarthritis   . Stroke ([place])   . Thromboembolus ([place])    Left femoral -tibial artery    Past Surgical History:  Procedure Laterality Date  . ABDOMINAL HYSTERECTOMY    . APPENDECTOMY    . THROMBOEMBOLECTOMY  05/30/11    Left femoral-popliteal artery   . TONSILLECTOMY       Medications:  Outpatient Encounter Prescriptions as of 08/13/2017  Medication Sig  . Cholecalciferol (VITAMIN D PO) Take by mouth.  . Cranberry 500 MG CAPS Take by mouth.  . furosemide (LASIX) 20 MG tablet Take 20 mg by mouth.  . IRON PO Take by mouth.  . levothyroxine (SYNTHROID, LEVOTHROID) 25 MCG tablet   . memantine (NAMENDA) 10 MG tablet   . metoprolol tartrate (LOPRESSOR) 50 MG tablet   . Multiple Vitamins-Minerals (MULTI FOR HER PO) Take by mouth.  . pravastatin (PRAVACHOL) 20 MG tablet   . tobramycin-dexamethasone (TOBRADEX) ophthalmic solution   . XARELTO 20 MG TABS tablet   . [DISCONTINUED] gabapentin (NEURONTIN) 300 MG capsule 100 mg 3 (three) times daily.   . B Complex Vitamins (VITAMIN-B COMPLEX) TABS Take by mouth.    . gabapentin (NEURONTIN) 100 MG capsule Take 2 capsules (200 mg total) by mouth 3 (three) times daily.  [name] lidocaine (LIDODERM) 5 % Place 1 patch onto the skin daily. Remove & Discard patch within 12 hours or as directed by MD  . Omega-3 Fatty Acids (FISH OIL PO) Take by mouth daily.    . pantoprazole (PROTONIX) 40 MG tablet Take 40 mg by mouth daily.    . pregabalin (LYRICA) 50 MG capsule Take 50 mg by mouth daily.  . rosuvastatin (CRESTOR) 5 MG tablet Take 5 mg by mouth  daily.    . warfarin (COUMADIN) 1 MG tablet Take 1 mg by mouth daily.  . [DISCONTINUED] metoprolol tartrate (LOPRESSOR) 25 MG tablet Take 25 mg by mouth 2 (two) times daily. One and one half tablets two time daily    No facility-administered encounter medications on file as of 08/13/2017.      Allergies:  Allergies  Allergen Reactions  . Sulfa Antibiotics     Family History: Family History  Problem Relation Age of Onset  . Breast cancer Mother   . Stroke Father     Social History: Social History  Substance Use Topics  . Smoking status: Former Smoker    Types: Cigarettes    Quit date: 06/26/1975  . Smokeless tobacco:  Never Used  . Alcohol use No   Social History   Social History Narrative   Lives alone in a one story home.  Has 3 children.  Retired from [employer].  Education: high school.      Review of Systems:  CONSTITUTIONAL: No fevers, chills, night sweats, or weight loss.   EYES: No visual changes or eye pain ENT: No hearing changes.  No history of nose bleeds.   RESPIRATORY: No cough, wheezing and shortness of breath.   CARDIOVASCULAR: Negative for chest pain, and palpitations.   GI: Negative for abdominal discomfort, blood in stools or black stools.  No recent change in bowel habits.   GU:  No history of incontinence.   MUSCLOSKELETAL: No history of joint pain or swelling.  No myalgias.   SKIN: Negative for lesions, rash, and itching.   HEMATOLOGY/ONCOLOGY: Negative for prolonged bleeding, bruising easily, and swollen nodes.     ENDOCRINE: Negative for cold or heat intolerance, polydipsia or goiter.   PSYCH:  No depression or anxiety symptoms.   NEURO: As Above.   Vital Signs:  BP 108/70   Pulse (!) 102   Ht 5\' 2"  (1.575 m)   Wt 206 lb (93.4 kg)   SpO2 96%   BMI 37.68 kg/m  Pain Scale: 0 on a scale of 0-10   General Medical Exam:   General:  Well appearing, comfortable.   Eyes/ENT: see cranial nerve examination.   Neck: No masses appreciated.  Full range of motion without tenderness.  No carotid bruits. Respiratory:  Clear to auscultation, good air entry bilaterally.   Cardiac:  Irregularly irregular, no murmur.   Extremities:  No deformities, edema, or skin discoloration.  Skin:  No rashes or lesions.  Neurological Exam: MENTAL STATUS including orientation to time, place, person, recent and remote memory, attention span and concentration, language, and fund of knowledge is normal.  Speech is not dysarthric.  CRANIAL NERVES: II:  Right homonymous superior quadrantanopia.   Unremarkable fundi.   III-IV-VI: Pupils equal round and reactive to light.  Normal conjugate,  extra-ocular eye movements in all directions of gaze.  No nystagmus.  No ptosis.   V:  Normal facial sensation.     VII:  Normal facial symmetry and movements.   VIII:  Normal hearing and vestibular function.   IX-X:  Normal palatal movement.   XI:  Normal shoulder shrug and head rotation.   XII:  Normal tongue strength and range of motion, no deviation or fasciculation.  MOTOR:  Motor strength is 5/5 throughout.  No atrophy, fasciculations or abnormal movements.  No pronator drift.  Tone is normal.    MSRs:  Right  Left brachioradialis 2+  brachioradialis 2+  biceps 2+  biceps 2+  triceps 2+  triceps 2+  patellar 1+  patellar 1+  ankle jerk 0  ankle jerk 0  Hoffman no  Hoffman no  plantar response down  plantar response down   SENSORY:  Hyperesthesia to temperature and pin prick over the right T6-7 dermatoma.  Vibration is mildly reduced in the feet which age-appropriate.    COORDINATION/GAIT: Normal finger-to- nose-finger.  Intact rapid alternating movements bilaterally.  Gait narrow based and stable. Tandem and stressed gait intact.    IMPRESSION: 1.  Post herpetic neuralgia affecting the right T6-7 dermatome   - optimize gabapentin to 200mg  TID, slowly titrating by 100mg /week as to minimize side effects  - start lidocaine patch to the affected area; if this is too expensive, try OTC lidocaine alternative   - strategies to minimize tactile stimuli that aggravates her pain were discussed  2.  Senile dementia.  She has some cognitive deficits with remembering things and over the years has given up driving as well as managing her finances.  Her son is POA and daughter helps manage medications.    - Continue Namenda 10mg /d   - Check vitamin B12 level  3.  History of ischemic stroke due to atrial fibrillation with residual right homonymous superior quadrantanopia.  She is managed with xeralto for atrial fibrillation.    Return to clinic in 4 months.   The duration of this appointment visit was 45 minutes of face-to-face time with the patient.  Greater than 50% of this time was spent in counseling, explanation of diagnosis, planning of further management, and coordination of care.   Thank you for allowing me to participate in patient's care.  If I can answer any additional questions, I would be pleased to do so.    Sincerely,    Donika K. Posey Pronto, DO

## 2017-08-13 NOTE — Patient Instructions (Addendum)
Gabapentin 100 mg tablets    Morning       Afternoon        Evening  Week 1 1 tab             1 tab             2 tab              Week 2 2 tab          1 tab         2 tab              Continue  2 tab          2 tab            2 tab         Call with update in 1 month, to determine further increase in medication.  If you develop increased sleepiness, stay at the lower dose.           I have sent a prescription for lidocaine patch to your pharmacy.  If it is too expensive, ask about over the counter alternative options with lidocaine          Check vitamin B12  Return to clinic in 4 months

## 2017-08-17 ENCOUNTER — Telehealth: Payer: Self-pay | Admitting: *Deleted

## 2017-08-17 ENCOUNTER — Other Ambulatory Visit: Payer: Self-pay | Admitting: *Deleted

## 2017-08-17 MED ORDER — GABAPENTIN 300 MG PO CAPS: 300.0000 mg | ORAL_CAPSULE | Freq: Three times a day (TID) | ORAL | 5 refills | Status: DC

## 2017-08-17 NOTE — Telephone Encounter (Signed)
Tammy (daughter) called needing to speak with you regarding her Prescription. Please Call. Thanks

## 2017-08-17 NOTE — Telephone Encounter (Signed)
100 mg gabapentin called in.  Patient needs 300 mg.  Will correct with pharmacy.

## 2017-08-17 NOTE — Telephone Encounter (Signed)
Patient's daughter notified.

## 2017-08-17 NOTE — Telephone Encounter (Signed)
I called patient's daughter back and they would like to take the gabapentin 300 mg as follows: 2 po qam, 1 po at noon and 1 po qhs.

## 2017-08-17 NOTE — Telephone Encounter (Signed)
-----   Message from Alda Berthold, DO sent at 08/17/2017  9:31 AM EDT ----- Please inform patient B12 is normal, thanks.

## 2017-10-01 DIAGNOSIS — Z23 Encounter for immunization: Secondary | ICD-10-CM | POA: Diagnosis not present

## 2017-11-18 DIAGNOSIS — G629 Polyneuropathy, unspecified: Secondary | ICD-10-CM | POA: Diagnosis not present

## 2017-11-18 DIAGNOSIS — I2510 Atherosclerotic heart disease of native coronary artery without angina pectoris: Secondary | ICD-10-CM | POA: Diagnosis not present

## 2017-11-18 DIAGNOSIS — I10 Essential (primary) hypertension: Secondary | ICD-10-CM | POA: Diagnosis not present

## 2017-11-18 DIAGNOSIS — E785 Hyperlipidemia, unspecified: Secondary | ICD-10-CM | POA: Diagnosis not present

## 2017-11-18 DIAGNOSIS — E559 Vitamin D deficiency, unspecified: Secondary | ICD-10-CM | POA: Diagnosis not present

## 2017-11-18 DIAGNOSIS — I4891 Unspecified atrial fibrillation: Secondary | ICD-10-CM | POA: Diagnosis not present

## 2017-11-18 DIAGNOSIS — Z79899 Other long term (current) drug therapy: Secondary | ICD-10-CM | POA: Diagnosis not present

## 2017-11-18 DIAGNOSIS — F0390 Unspecified dementia without behavioral disturbance: Secondary | ICD-10-CM | POA: Diagnosis not present

## 2017-11-18 DIAGNOSIS — E039 Hypothyroidism, unspecified: Secondary | ICD-10-CM | POA: Diagnosis not present

## 2017-12-22 ENCOUNTER — Ambulatory Visit: Payer: Medicare HMO | Admitting: Neurology

## 2017-12-22 ENCOUNTER — Encounter: Payer: Self-pay | Admitting: Neurology

## 2017-12-22 VITALS — BP 110/68 | HR 85 | Ht 62.0 in | Wt 210.2 lb

## 2017-12-22 DIAGNOSIS — B0229 Other postherpetic nervous system involvement: Secondary | ICD-10-CM | POA: Insufficient documentation

## 2017-12-22 DIAGNOSIS — F0390 Unspecified dementia without behavioral disturbance: Secondary | ICD-10-CM

## 2017-12-22 NOTE — Progress Notes (Signed)
Follow-up Visit   Date: 12/22/17    Connie Baker MRN: [id number] DOB: 03-31-1929   Interim History: Connie Baker is a 84 y.o. right-handed Caucasian female with hypothyroidism, GERD, hyperlipidemia, atrial fibrillation on Xeralto, stroke (residual visual field deficits) returning to the clinic for follow-up of post-herpetic neuralgia.  The patient was accompanied to the clinic by daughter who also provides collateral information.    History of present illness: In 2012, she had shingles involving the right flank and abdomen and has had residual pain over this region.  She has burning, stabbing, pain which is intermittent.It is worse over the right breast and she has shocking pain over the posterior thoraxi.   She gets some relief with resting and not moving.  She often sits in her recliner and supports the area with right hand.  Her bra can aggravate her pain.  She has tried Lyrica 50mg  TID, but this was discontinued due to daytime sedation and expense.  She was transitioned to gabapentin 100mg  TID, which helps some and does not make her sleepy.  She lives with her daughter who helps manage her medications.  Her son is POA who manages finances.  She is able to perform all ADLs herself.   UPDATE 12/22/2017:  She is here for follow-up visit.  After increasing her gabapentin 600mg  in the morning, 300mg  in the afternoon, and 300mg  at bedtime which completely controls her pain.  She is extremely happy that she no longer has any pain.  No new neurological complaints.  She continues to live alone and is thinking of moving into assisted living later this year.  No new memory changes or home safety issues.   Medications:  Current Outpatient Medications on File Prior to Visit  Medication Sig Dispense Refill  . B Complex Vitamins (VITAMIN-B COMPLEX) TABS Take by mouth.      . Cholecalciferol (VITAMIN D PO) Take by mouth.    . Cranberry 500 MG CAPS Take by mouth.    . furosemide (LASIX) 20  MG tablet Take 20 mg by mouth.    . gabapentin (NEURONTIN) 300 MG capsule Take 1 capsule (300 mg total) by mouth 3 (three) times daily. Take 2 capsules (600 mg) qam and 1 capsule (300 mg) afternoon and 1 capsule (300 mg) qhs. 120 capsule 5  . levothyroxine (SYNTHROID, LEVOTHROID) 25 MCG tablet     . memantine (NAMENDA) 10 MG tablet     . metoprolol tartrate (LOPRESSOR) 50 MG tablet     . Multiple Vitamins-Minerals (MULTI FOR HER PO) Take by mouth.    . pravastatin (PRAVACHOL) 20 MG tablet     . XARELTO 20 MG TABS tablet      No current facility-administered medications on file prior to visit.     Allergies:  Allergies  Allergen Reactions  . Sulfa Antibiotics     Review of Systems:  CONSTITUTIONAL: No fevers, chills, night sweats, or weight loss.  EYES: No visual changes or eye pain ENT: No hearing changes.  No history of nose bleeds.   RESPIRATORY: No cough, wheezing and shortness of breath.   CARDIOVASCULAR: Negative for chest pain, and palpitations.   GI: Negative for abdominal discomfort, blood in stools or black stools.  No recent change in bowel habits.   GU:  No history of incontinence.   MUSCLOSKELETAL: No history of joint pain or swelling.  No myalgias.   SKIN: Negative for lesions, rash, and itching.   ENDOCRINE: Negative for cold or heat  intolerance, polydipsia or goiter.   PSYCH:  No depression or anxiety symptoms.   NEURO: As Above.   Vital Signs:  BP 110/68   Pulse 85   Ht 5\' 2"  (1.575 m)   Wt 210 lb 4 oz (95.4 kg)   SpO2 94%   BMI 38.46 kg/m   General: Comfortable, well appearing  Neurological Exam: MENTAL STATUS including orientation to time, place, person, recent and remote memory, attention span and concentration, language, and fund of knowledge is normal.  Speech is not dysarthric.  CRANIAL NERVES:  R homonymous superior quadrantanopia.  Pupils equal round and reactive to light.  Normal conjugate, extra-ocular eye movements in all directions of gaze.   No ptosis. Face is symmetric. Palate elevates symmetrically.  Tongue is midline.  MOTOR:  Motor strength is 5/5 in all extremities.  No pronator drift.  Tone is normal.    COORDINATION/GAIT:  Gait is assisted with walker, slow and stable   Data: Lab Results  Component Value Date   VITAMINB12 520 08/13/2017     IMPRESSION/PLAN: 1.  Post-herpetic neuralgia affecting the right flank.  She is very pleased with her pain releif with gabapentin, which will be continued at 600mg  at bedtime, 300mg  in the afternoon, and 300mg  at bedtime.  2.  Senile dementia.  Overall, she is doing well and continues to manage all IADLs, except she does not drive.  Her niece helps with finances.  Her son is POA and daughter helps with medications.  She is exploring options of moving into assisted living later this year.  Continue Namenda 10mg /d.  3.  History of ischemic stroke due to atrial fibrillation with residual right homonymous superior quadrantanopia.  She is managed with xeralto for atrial fibrillation.   Return to clinic 9 months  Thank you for allowing me to participate in patient's care.  If I can answer any additional questions, I would be pleased to do so.    Sincerely,    Jevante Hollibaugh K. Posey Pronto, DO

## 2017-12-22 NOTE — Patient Instructions (Signed)
Great to hear that your pain is better controlled.  Continue gabapentin as you are taking  Return to clinic 1 year

## 2018-01-29 DIAGNOSIS — I129 Hypertensive chronic kidney disease with stage 1 through stage 4 chronic kidney disease, or unspecified chronic kidney disease: Secondary | ICD-10-CM | POA: Diagnosis not present

## 2018-01-29 DIAGNOSIS — N189 Chronic kidney disease, unspecified: Secondary | ICD-10-CM | POA: Diagnosis not present

## 2018-01-29 DIAGNOSIS — M8580 Other specified disorders of bone density and structure, unspecified site: Secondary | ICD-10-CM | POA: Diagnosis not present

## 2018-01-29 DIAGNOSIS — M1991 Primary osteoarthritis, unspecified site: Secondary | ICD-10-CM | POA: Diagnosis not present

## 2018-01-29 DIAGNOSIS — I4891 Unspecified atrial fibrillation: Secondary | ICD-10-CM | POA: Diagnosis not present

## 2018-01-29 DIAGNOSIS — I2510 Atherosclerotic heart disease of native coronary artery without angina pectoris: Secondary | ICD-10-CM | POA: Diagnosis not present

## 2018-01-29 DIAGNOSIS — F0390 Unspecified dementia without behavioral disturbance: Secondary | ICD-10-CM | POA: Diagnosis not present

## 2018-01-29 DIAGNOSIS — F329 Major depressive disorder, single episode, unspecified: Secondary | ICD-10-CM | POA: Diagnosis not present

## 2018-01-29 DIAGNOSIS — G629 Polyneuropathy, unspecified: Secondary | ICD-10-CM | POA: Diagnosis not present

## 2018-02-01 DIAGNOSIS — I2510 Atherosclerotic heart disease of native coronary artery without angina pectoris: Secondary | ICD-10-CM | POA: Diagnosis not present

## 2018-02-01 DIAGNOSIS — M8580 Other specified disorders of bone density and structure, unspecified site: Secondary | ICD-10-CM | POA: Diagnosis not present

## 2018-02-01 DIAGNOSIS — F329 Major depressive disorder, single episode, unspecified: Secondary | ICD-10-CM | POA: Diagnosis not present

## 2018-02-01 DIAGNOSIS — F0390 Unspecified dementia without behavioral disturbance: Secondary | ICD-10-CM | POA: Diagnosis not present

## 2018-02-01 DIAGNOSIS — G629 Polyneuropathy, unspecified: Secondary | ICD-10-CM | POA: Diagnosis not present

## 2018-02-01 DIAGNOSIS — I129 Hypertensive chronic kidney disease with stage 1 through stage 4 chronic kidney disease, or unspecified chronic kidney disease: Secondary | ICD-10-CM | POA: Diagnosis not present

## 2018-02-01 DIAGNOSIS — M1991 Primary osteoarthritis, unspecified site: Secondary | ICD-10-CM | POA: Diagnosis not present

## 2018-02-01 DIAGNOSIS — N189 Chronic kidney disease, unspecified: Secondary | ICD-10-CM | POA: Diagnosis not present

## 2018-02-01 DIAGNOSIS — I4891 Unspecified atrial fibrillation: Secondary | ICD-10-CM | POA: Diagnosis not present

## 2018-02-03 DIAGNOSIS — M8580 Other specified disorders of bone density and structure, unspecified site: Secondary | ICD-10-CM | POA: Diagnosis not present

## 2018-02-03 DIAGNOSIS — F329 Major depressive disorder, single episode, unspecified: Secondary | ICD-10-CM | POA: Diagnosis not present

## 2018-02-03 DIAGNOSIS — G629 Polyneuropathy, unspecified: Secondary | ICD-10-CM | POA: Diagnosis not present

## 2018-02-03 DIAGNOSIS — M1991 Primary osteoarthritis, unspecified site: Secondary | ICD-10-CM | POA: Diagnosis not present

## 2018-02-03 DIAGNOSIS — I2510 Atherosclerotic heart disease of native coronary artery without angina pectoris: Secondary | ICD-10-CM | POA: Diagnosis not present

## 2018-02-03 DIAGNOSIS — N189 Chronic kidney disease, unspecified: Secondary | ICD-10-CM | POA: Diagnosis not present

## 2018-02-03 DIAGNOSIS — I129 Hypertensive chronic kidney disease with stage 1 through stage 4 chronic kidney disease, or unspecified chronic kidney disease: Secondary | ICD-10-CM | POA: Diagnosis not present

## 2018-02-03 DIAGNOSIS — F0390 Unspecified dementia without behavioral disturbance: Secondary | ICD-10-CM | POA: Diagnosis not present

## 2018-02-03 DIAGNOSIS — I4891 Unspecified atrial fibrillation: Secondary | ICD-10-CM | POA: Diagnosis not present

## 2018-02-07 DIAGNOSIS — F329 Major depressive disorder, single episode, unspecified: Secondary | ICD-10-CM | POA: Diagnosis not present

## 2018-02-07 DIAGNOSIS — I2510 Atherosclerotic heart disease of native coronary artery without angina pectoris: Secondary | ICD-10-CM | POA: Diagnosis not present

## 2018-02-07 DIAGNOSIS — G629 Polyneuropathy, unspecified: Secondary | ICD-10-CM | POA: Diagnosis not present

## 2018-02-07 DIAGNOSIS — M1991 Primary osteoarthritis, unspecified site: Secondary | ICD-10-CM | POA: Diagnosis not present

## 2018-02-07 DIAGNOSIS — M8580 Other specified disorders of bone density and structure, unspecified site: Secondary | ICD-10-CM | POA: Diagnosis not present

## 2018-02-07 DIAGNOSIS — N189 Chronic kidney disease, unspecified: Secondary | ICD-10-CM | POA: Diagnosis not present

## 2018-02-07 DIAGNOSIS — I129 Hypertensive chronic kidney disease with stage 1 through stage 4 chronic kidney disease, or unspecified chronic kidney disease: Secondary | ICD-10-CM | POA: Diagnosis not present

## 2018-02-07 DIAGNOSIS — F0390 Unspecified dementia without behavioral disturbance: Secondary | ICD-10-CM | POA: Diagnosis not present

## 2018-02-07 DIAGNOSIS — I4891 Unspecified atrial fibrillation: Secondary | ICD-10-CM | POA: Diagnosis not present

## 2018-02-08 DIAGNOSIS — M1991 Primary osteoarthritis, unspecified site: Secondary | ICD-10-CM | POA: Diagnosis not present

## 2018-02-08 DIAGNOSIS — F0390 Unspecified dementia without behavioral disturbance: Secondary | ICD-10-CM | POA: Diagnosis not present

## 2018-02-08 DIAGNOSIS — N189 Chronic kidney disease, unspecified: Secondary | ICD-10-CM | POA: Diagnosis not present

## 2018-02-08 DIAGNOSIS — I129 Hypertensive chronic kidney disease with stage 1 through stage 4 chronic kidney disease, or unspecified chronic kidney disease: Secondary | ICD-10-CM | POA: Diagnosis not present

## 2018-02-08 DIAGNOSIS — M8580 Other specified disorders of bone density and structure, unspecified site: Secondary | ICD-10-CM | POA: Diagnosis not present

## 2018-02-08 DIAGNOSIS — I4891 Unspecified atrial fibrillation: Secondary | ICD-10-CM | POA: Diagnosis not present

## 2018-02-08 DIAGNOSIS — G629 Polyneuropathy, unspecified: Secondary | ICD-10-CM | POA: Diagnosis not present

## 2018-02-08 DIAGNOSIS — I2510 Atherosclerotic heart disease of native coronary artery without angina pectoris: Secondary | ICD-10-CM | POA: Diagnosis not present

## 2018-02-08 DIAGNOSIS — F329 Major depressive disorder, single episode, unspecified: Secondary | ICD-10-CM | POA: Diagnosis not present

## 2018-02-09 DIAGNOSIS — I2510 Atherosclerotic heart disease of native coronary artery without angina pectoris: Secondary | ICD-10-CM | POA: Diagnosis not present

## 2018-02-09 DIAGNOSIS — I129 Hypertensive chronic kidney disease with stage 1 through stage 4 chronic kidney disease, or unspecified chronic kidney disease: Secondary | ICD-10-CM | POA: Diagnosis not present

## 2018-02-09 DIAGNOSIS — I4891 Unspecified atrial fibrillation: Secondary | ICD-10-CM | POA: Diagnosis not present

## 2018-02-09 DIAGNOSIS — G629 Polyneuropathy, unspecified: Secondary | ICD-10-CM | POA: Diagnosis not present

## 2018-02-09 DIAGNOSIS — F0390 Unspecified dementia without behavioral disturbance: Secondary | ICD-10-CM | POA: Diagnosis not present

## 2018-02-09 DIAGNOSIS — M8580 Other specified disorders of bone density and structure, unspecified site: Secondary | ICD-10-CM | POA: Diagnosis not present

## 2018-02-09 DIAGNOSIS — F329 Major depressive disorder, single episode, unspecified: Secondary | ICD-10-CM | POA: Diagnosis not present

## 2018-02-09 DIAGNOSIS — N189 Chronic kidney disease, unspecified: Secondary | ICD-10-CM | POA: Diagnosis not present

## 2018-02-09 DIAGNOSIS — M1991 Primary osteoarthritis, unspecified site: Secondary | ICD-10-CM | POA: Diagnosis not present

## 2018-02-14 DIAGNOSIS — I129 Hypertensive chronic kidney disease with stage 1 through stage 4 chronic kidney disease, or unspecified chronic kidney disease: Secondary | ICD-10-CM | POA: Diagnosis not present

## 2018-02-14 DIAGNOSIS — I2510 Atherosclerotic heart disease of native coronary artery without angina pectoris: Secondary | ICD-10-CM | POA: Diagnosis not present

## 2018-02-14 DIAGNOSIS — M8580 Other specified disorders of bone density and structure, unspecified site: Secondary | ICD-10-CM | POA: Diagnosis not present

## 2018-02-14 DIAGNOSIS — G629 Polyneuropathy, unspecified: Secondary | ICD-10-CM | POA: Diagnosis not present

## 2018-02-14 DIAGNOSIS — M1991 Primary osteoarthritis, unspecified site: Secondary | ICD-10-CM | POA: Diagnosis not present

## 2018-02-14 DIAGNOSIS — F329 Major depressive disorder, single episode, unspecified: Secondary | ICD-10-CM | POA: Diagnosis not present

## 2018-02-14 DIAGNOSIS — I4891 Unspecified atrial fibrillation: Secondary | ICD-10-CM | POA: Diagnosis not present

## 2018-02-14 DIAGNOSIS — N189 Chronic kidney disease, unspecified: Secondary | ICD-10-CM | POA: Diagnosis not present

## 2018-02-14 DIAGNOSIS — F0390 Unspecified dementia without behavioral disturbance: Secondary | ICD-10-CM | POA: Diagnosis not present

## 2018-02-15 DIAGNOSIS — M8580 Other specified disorders of bone density and structure, unspecified site: Secondary | ICD-10-CM | POA: Diagnosis not present

## 2018-02-15 DIAGNOSIS — G629 Polyneuropathy, unspecified: Secondary | ICD-10-CM | POA: Diagnosis not present

## 2018-02-15 DIAGNOSIS — I4891 Unspecified atrial fibrillation: Secondary | ICD-10-CM | POA: Diagnosis not present

## 2018-02-15 DIAGNOSIS — F0390 Unspecified dementia without behavioral disturbance: Secondary | ICD-10-CM | POA: Diagnosis not present

## 2018-02-15 DIAGNOSIS — I129 Hypertensive chronic kidney disease with stage 1 through stage 4 chronic kidney disease, or unspecified chronic kidney disease: Secondary | ICD-10-CM | POA: Diagnosis not present

## 2018-02-15 DIAGNOSIS — N189 Chronic kidney disease, unspecified: Secondary | ICD-10-CM | POA: Diagnosis not present

## 2018-02-15 DIAGNOSIS — M1991 Primary osteoarthritis, unspecified site: Secondary | ICD-10-CM | POA: Diagnosis not present

## 2018-02-15 DIAGNOSIS — F329 Major depressive disorder, single episode, unspecified: Secondary | ICD-10-CM | POA: Diagnosis not present

## 2018-02-15 DIAGNOSIS — I2510 Atherosclerotic heart disease of native coronary artery without angina pectoris: Secondary | ICD-10-CM | POA: Diagnosis not present

## 2018-02-17 DIAGNOSIS — F0390 Unspecified dementia without behavioral disturbance: Secondary | ICD-10-CM | POA: Diagnosis not present

## 2018-02-17 DIAGNOSIS — I4891 Unspecified atrial fibrillation: Secondary | ICD-10-CM | POA: Diagnosis not present

## 2018-02-17 DIAGNOSIS — I2510 Atherosclerotic heart disease of native coronary artery without angina pectoris: Secondary | ICD-10-CM | POA: Diagnosis not present

## 2018-02-17 DIAGNOSIS — N189 Chronic kidney disease, unspecified: Secondary | ICD-10-CM | POA: Diagnosis not present

## 2018-02-17 DIAGNOSIS — I129 Hypertensive chronic kidney disease with stage 1 through stage 4 chronic kidney disease, or unspecified chronic kidney disease: Secondary | ICD-10-CM | POA: Diagnosis not present

## 2018-02-17 DIAGNOSIS — M8580 Other specified disorders of bone density and structure, unspecified site: Secondary | ICD-10-CM | POA: Diagnosis not present

## 2018-02-17 DIAGNOSIS — M1991 Primary osteoarthritis, unspecified site: Secondary | ICD-10-CM | POA: Diagnosis not present

## 2018-02-17 DIAGNOSIS — F329 Major depressive disorder, single episode, unspecified: Secondary | ICD-10-CM | POA: Diagnosis not present

## 2018-02-17 DIAGNOSIS — G629 Polyneuropathy, unspecified: Secondary | ICD-10-CM | POA: Diagnosis not present

## 2018-02-21 ENCOUNTER — Other Ambulatory Visit: Payer: Self-pay | Admitting: Neurology

## 2018-02-21 DIAGNOSIS — I4891 Unspecified atrial fibrillation: Secondary | ICD-10-CM | POA: Diagnosis not present

## 2018-02-21 DIAGNOSIS — I2510 Atherosclerotic heart disease of native coronary artery without angina pectoris: Secondary | ICD-10-CM | POA: Diagnosis not present

## 2018-02-21 DIAGNOSIS — N189 Chronic kidney disease, unspecified: Secondary | ICD-10-CM | POA: Diagnosis not present

## 2018-02-21 DIAGNOSIS — G629 Polyneuropathy, unspecified: Secondary | ICD-10-CM | POA: Diagnosis not present

## 2018-02-21 DIAGNOSIS — I129 Hypertensive chronic kidney disease with stage 1 through stage 4 chronic kidney disease, or unspecified chronic kidney disease: Secondary | ICD-10-CM | POA: Diagnosis not present

## 2018-02-21 DIAGNOSIS — M8580 Other specified disorders of bone density and structure, unspecified site: Secondary | ICD-10-CM | POA: Diagnosis not present

## 2018-02-21 DIAGNOSIS — F0390 Unspecified dementia without behavioral disturbance: Secondary | ICD-10-CM | POA: Diagnosis not present

## 2018-02-21 DIAGNOSIS — M1991 Primary osteoarthritis, unspecified site: Secondary | ICD-10-CM | POA: Diagnosis not present

## 2018-02-21 DIAGNOSIS — F329 Major depressive disorder, single episode, unspecified: Secondary | ICD-10-CM | POA: Diagnosis not present

## 2018-02-23 DIAGNOSIS — M1991 Primary osteoarthritis, unspecified site: Secondary | ICD-10-CM | POA: Diagnosis not present

## 2018-02-23 DIAGNOSIS — I2510 Atherosclerotic heart disease of native coronary artery without angina pectoris: Secondary | ICD-10-CM | POA: Diagnosis not present

## 2018-02-23 DIAGNOSIS — G629 Polyneuropathy, unspecified: Secondary | ICD-10-CM | POA: Diagnosis not present

## 2018-02-23 DIAGNOSIS — F329 Major depressive disorder, single episode, unspecified: Secondary | ICD-10-CM | POA: Diagnosis not present

## 2018-02-23 DIAGNOSIS — I129 Hypertensive chronic kidney disease with stage 1 through stage 4 chronic kidney disease, or unspecified chronic kidney disease: Secondary | ICD-10-CM | POA: Diagnosis not present

## 2018-02-23 DIAGNOSIS — M8580 Other specified disorders of bone density and structure, unspecified site: Secondary | ICD-10-CM | POA: Diagnosis not present

## 2018-02-23 DIAGNOSIS — F0390 Unspecified dementia without behavioral disturbance: Secondary | ICD-10-CM | POA: Diagnosis not present

## 2018-02-23 DIAGNOSIS — I4891 Unspecified atrial fibrillation: Secondary | ICD-10-CM | POA: Diagnosis not present

## 2018-02-23 DIAGNOSIS — N189 Chronic kidney disease, unspecified: Secondary | ICD-10-CM | POA: Diagnosis not present

## 2018-02-24 DIAGNOSIS — F329 Major depressive disorder, single episode, unspecified: Secondary | ICD-10-CM | POA: Diagnosis not present

## 2018-02-24 DIAGNOSIS — M8580 Other specified disorders of bone density and structure, unspecified site: Secondary | ICD-10-CM | POA: Diagnosis not present

## 2018-02-24 DIAGNOSIS — N189 Chronic kidney disease, unspecified: Secondary | ICD-10-CM | POA: Diagnosis not present

## 2018-02-24 DIAGNOSIS — F0390 Unspecified dementia without behavioral disturbance: Secondary | ICD-10-CM | POA: Diagnosis not present

## 2018-02-24 DIAGNOSIS — I4891 Unspecified atrial fibrillation: Secondary | ICD-10-CM | POA: Diagnosis not present

## 2018-02-24 DIAGNOSIS — G629 Polyneuropathy, unspecified: Secondary | ICD-10-CM | POA: Diagnosis not present

## 2018-02-24 DIAGNOSIS — I129 Hypertensive chronic kidney disease with stage 1 through stage 4 chronic kidney disease, or unspecified chronic kidney disease: Secondary | ICD-10-CM | POA: Diagnosis not present

## 2018-02-24 DIAGNOSIS — I2510 Atherosclerotic heart disease of native coronary artery without angina pectoris: Secondary | ICD-10-CM | POA: Diagnosis not present

## 2018-02-24 DIAGNOSIS — M1991 Primary osteoarthritis, unspecified site: Secondary | ICD-10-CM | POA: Diagnosis not present

## 2018-02-28 DIAGNOSIS — G629 Polyneuropathy, unspecified: Secondary | ICD-10-CM | POA: Diagnosis not present

## 2018-02-28 DIAGNOSIS — F329 Major depressive disorder, single episode, unspecified: Secondary | ICD-10-CM | POA: Diagnosis not present

## 2018-02-28 DIAGNOSIS — N189 Chronic kidney disease, unspecified: Secondary | ICD-10-CM | POA: Diagnosis not present

## 2018-02-28 DIAGNOSIS — F0390 Unspecified dementia without behavioral disturbance: Secondary | ICD-10-CM | POA: Diagnosis not present

## 2018-02-28 DIAGNOSIS — M8580 Other specified disorders of bone density and structure, unspecified site: Secondary | ICD-10-CM | POA: Diagnosis not present

## 2018-02-28 DIAGNOSIS — I4891 Unspecified atrial fibrillation: Secondary | ICD-10-CM | POA: Diagnosis not present

## 2018-02-28 DIAGNOSIS — I129 Hypertensive chronic kidney disease with stage 1 through stage 4 chronic kidney disease, or unspecified chronic kidney disease: Secondary | ICD-10-CM | POA: Diagnosis not present

## 2018-02-28 DIAGNOSIS — M1991 Primary osteoarthritis, unspecified site: Secondary | ICD-10-CM | POA: Diagnosis not present

## 2018-02-28 DIAGNOSIS — I2510 Atherosclerotic heart disease of native coronary artery without angina pectoris: Secondary | ICD-10-CM | POA: Diagnosis not present

## 2018-03-01 DIAGNOSIS — F329 Major depressive disorder, single episode, unspecified: Secondary | ICD-10-CM | POA: Diagnosis not present

## 2018-03-01 DIAGNOSIS — M1991 Primary osteoarthritis, unspecified site: Secondary | ICD-10-CM | POA: Diagnosis not present

## 2018-03-01 DIAGNOSIS — G629 Polyneuropathy, unspecified: Secondary | ICD-10-CM | POA: Diagnosis not present

## 2018-03-01 DIAGNOSIS — N189 Chronic kidney disease, unspecified: Secondary | ICD-10-CM | POA: Diagnosis not present

## 2018-03-01 DIAGNOSIS — I129 Hypertensive chronic kidney disease with stage 1 through stage 4 chronic kidney disease, or unspecified chronic kidney disease: Secondary | ICD-10-CM | POA: Diagnosis not present

## 2018-03-01 DIAGNOSIS — M8580 Other specified disorders of bone density and structure, unspecified site: Secondary | ICD-10-CM | POA: Diagnosis not present

## 2018-03-01 DIAGNOSIS — I4891 Unspecified atrial fibrillation: Secondary | ICD-10-CM | POA: Diagnosis not present

## 2018-03-01 DIAGNOSIS — F0390 Unspecified dementia without behavioral disturbance: Secondary | ICD-10-CM | POA: Diagnosis not present

## 2018-03-01 DIAGNOSIS — I2510 Atherosclerotic heart disease of native coronary artery without angina pectoris: Secondary | ICD-10-CM | POA: Diagnosis not present

## 2018-03-03 DIAGNOSIS — F0390 Unspecified dementia without behavioral disturbance: Secondary | ICD-10-CM | POA: Diagnosis not present

## 2018-03-03 DIAGNOSIS — I129 Hypertensive chronic kidney disease with stage 1 through stage 4 chronic kidney disease, or unspecified chronic kidney disease: Secondary | ICD-10-CM | POA: Diagnosis not present

## 2018-03-03 DIAGNOSIS — M8580 Other specified disorders of bone density and structure, unspecified site: Secondary | ICD-10-CM | POA: Diagnosis not present

## 2018-03-03 DIAGNOSIS — I4891 Unspecified atrial fibrillation: Secondary | ICD-10-CM | POA: Diagnosis not present

## 2018-03-03 DIAGNOSIS — M1991 Primary osteoarthritis, unspecified site: Secondary | ICD-10-CM | POA: Diagnosis not present

## 2018-03-03 DIAGNOSIS — N189 Chronic kidney disease, unspecified: Secondary | ICD-10-CM | POA: Diagnosis not present

## 2018-03-03 DIAGNOSIS — F329 Major depressive disorder, single episode, unspecified: Secondary | ICD-10-CM | POA: Diagnosis not present

## 2018-03-03 DIAGNOSIS — G629 Polyneuropathy, unspecified: Secondary | ICD-10-CM | POA: Diagnosis not present

## 2018-03-03 DIAGNOSIS — I2510 Atherosclerotic heart disease of native coronary artery without angina pectoris: Secondary | ICD-10-CM | POA: Diagnosis not present

## 2018-03-07 DIAGNOSIS — M1991 Primary osteoarthritis, unspecified site: Secondary | ICD-10-CM | POA: Diagnosis not present

## 2018-03-07 DIAGNOSIS — I4891 Unspecified atrial fibrillation: Secondary | ICD-10-CM | POA: Diagnosis not present

## 2018-03-07 DIAGNOSIS — F329 Major depressive disorder, single episode, unspecified: Secondary | ICD-10-CM | POA: Diagnosis not present

## 2018-03-07 DIAGNOSIS — I2510 Atherosclerotic heart disease of native coronary artery without angina pectoris: Secondary | ICD-10-CM | POA: Diagnosis not present

## 2018-03-07 DIAGNOSIS — N189 Chronic kidney disease, unspecified: Secondary | ICD-10-CM | POA: Diagnosis not present

## 2018-03-07 DIAGNOSIS — I129 Hypertensive chronic kidney disease with stage 1 through stage 4 chronic kidney disease, or unspecified chronic kidney disease: Secondary | ICD-10-CM | POA: Diagnosis not present

## 2018-03-07 DIAGNOSIS — F0390 Unspecified dementia without behavioral disturbance: Secondary | ICD-10-CM | POA: Diagnosis not present

## 2018-03-07 DIAGNOSIS — M8580 Other specified disorders of bone density and structure, unspecified site: Secondary | ICD-10-CM | POA: Diagnosis not present

## 2018-03-07 DIAGNOSIS — G629 Polyneuropathy, unspecified: Secondary | ICD-10-CM | POA: Diagnosis not present

## 2018-03-08 DIAGNOSIS — G629 Polyneuropathy, unspecified: Secondary | ICD-10-CM | POA: Diagnosis not present

## 2018-03-08 DIAGNOSIS — M8580 Other specified disorders of bone density and structure, unspecified site: Secondary | ICD-10-CM | POA: Diagnosis not present

## 2018-03-08 DIAGNOSIS — F0390 Unspecified dementia without behavioral disturbance: Secondary | ICD-10-CM | POA: Diagnosis not present

## 2018-03-08 DIAGNOSIS — M1991 Primary osteoarthritis, unspecified site: Secondary | ICD-10-CM | POA: Diagnosis not present

## 2018-03-08 DIAGNOSIS — I129 Hypertensive chronic kidney disease with stage 1 through stage 4 chronic kidney disease, or unspecified chronic kidney disease: Secondary | ICD-10-CM | POA: Diagnosis not present

## 2018-03-08 DIAGNOSIS — I4891 Unspecified atrial fibrillation: Secondary | ICD-10-CM | POA: Diagnosis not present

## 2018-03-08 DIAGNOSIS — I2510 Atherosclerotic heart disease of native coronary artery without angina pectoris: Secondary | ICD-10-CM | POA: Diagnosis not present

## 2018-03-08 DIAGNOSIS — F329 Major depressive disorder, single episode, unspecified: Secondary | ICD-10-CM | POA: Diagnosis not present

## 2018-03-08 DIAGNOSIS — N189 Chronic kidney disease, unspecified: Secondary | ICD-10-CM | POA: Diagnosis not present

## 2018-03-10 DIAGNOSIS — M1991 Primary osteoarthritis, unspecified site: Secondary | ICD-10-CM | POA: Diagnosis not present

## 2018-03-10 DIAGNOSIS — I2510 Atherosclerotic heart disease of native coronary artery without angina pectoris: Secondary | ICD-10-CM | POA: Diagnosis not present

## 2018-03-10 DIAGNOSIS — I4891 Unspecified atrial fibrillation: Secondary | ICD-10-CM | POA: Diagnosis not present

## 2018-03-10 DIAGNOSIS — G629 Polyneuropathy, unspecified: Secondary | ICD-10-CM | POA: Diagnosis not present

## 2018-03-10 DIAGNOSIS — M8580 Other specified disorders of bone density and structure, unspecified site: Secondary | ICD-10-CM | POA: Diagnosis not present

## 2018-03-10 DIAGNOSIS — F0390 Unspecified dementia without behavioral disturbance: Secondary | ICD-10-CM | POA: Diagnosis not present

## 2018-03-10 DIAGNOSIS — I129 Hypertensive chronic kidney disease with stage 1 through stage 4 chronic kidney disease, or unspecified chronic kidney disease: Secondary | ICD-10-CM | POA: Diagnosis not present

## 2018-03-10 DIAGNOSIS — F329 Major depressive disorder, single episode, unspecified: Secondary | ICD-10-CM | POA: Diagnosis not present

## 2018-03-10 DIAGNOSIS — N189 Chronic kidney disease, unspecified: Secondary | ICD-10-CM | POA: Diagnosis not present

## 2018-03-14 DIAGNOSIS — G629 Polyneuropathy, unspecified: Secondary | ICD-10-CM | POA: Diagnosis not present

## 2018-03-14 DIAGNOSIS — I4891 Unspecified atrial fibrillation: Secondary | ICD-10-CM | POA: Diagnosis not present

## 2018-03-14 DIAGNOSIS — M1991 Primary osteoarthritis, unspecified site: Secondary | ICD-10-CM | POA: Diagnosis not present

## 2018-03-14 DIAGNOSIS — I129 Hypertensive chronic kidney disease with stage 1 through stage 4 chronic kidney disease, or unspecified chronic kidney disease: Secondary | ICD-10-CM | POA: Diagnosis not present

## 2018-03-14 DIAGNOSIS — I2510 Atherosclerotic heart disease of native coronary artery without angina pectoris: Secondary | ICD-10-CM | POA: Diagnosis not present

## 2018-03-14 DIAGNOSIS — F0390 Unspecified dementia without behavioral disturbance: Secondary | ICD-10-CM | POA: Diagnosis not present

## 2018-03-14 DIAGNOSIS — M8580 Other specified disorders of bone density and structure, unspecified site: Secondary | ICD-10-CM | POA: Diagnosis not present

## 2018-03-14 DIAGNOSIS — F329 Major depressive disorder, single episode, unspecified: Secondary | ICD-10-CM | POA: Diagnosis not present

## 2018-03-14 DIAGNOSIS — N189 Chronic kidney disease, unspecified: Secondary | ICD-10-CM | POA: Diagnosis not present

## 2018-03-15 DIAGNOSIS — M1991 Primary osteoarthritis, unspecified site: Secondary | ICD-10-CM | POA: Diagnosis not present

## 2018-03-15 DIAGNOSIS — G629 Polyneuropathy, unspecified: Secondary | ICD-10-CM | POA: Diagnosis not present

## 2018-03-15 DIAGNOSIS — I4891 Unspecified atrial fibrillation: Secondary | ICD-10-CM | POA: Diagnosis not present

## 2018-03-15 DIAGNOSIS — F329 Major depressive disorder, single episode, unspecified: Secondary | ICD-10-CM | POA: Diagnosis not present

## 2018-03-15 DIAGNOSIS — M8580 Other specified disorders of bone density and structure, unspecified site: Secondary | ICD-10-CM | POA: Diagnosis not present

## 2018-03-15 DIAGNOSIS — I129 Hypertensive chronic kidney disease with stage 1 through stage 4 chronic kidney disease, or unspecified chronic kidney disease: Secondary | ICD-10-CM | POA: Diagnosis not present

## 2018-03-15 DIAGNOSIS — N189 Chronic kidney disease, unspecified: Secondary | ICD-10-CM | POA: Diagnosis not present

## 2018-03-15 DIAGNOSIS — I2510 Atherosclerotic heart disease of native coronary artery without angina pectoris: Secondary | ICD-10-CM | POA: Diagnosis not present

## 2018-03-15 DIAGNOSIS — F0390 Unspecified dementia without behavioral disturbance: Secondary | ICD-10-CM | POA: Diagnosis not present

## 2018-03-17 DIAGNOSIS — I2510 Atherosclerotic heart disease of native coronary artery without angina pectoris: Secondary | ICD-10-CM | POA: Diagnosis not present

## 2018-03-17 DIAGNOSIS — I129 Hypertensive chronic kidney disease with stage 1 through stage 4 chronic kidney disease, or unspecified chronic kidney disease: Secondary | ICD-10-CM | POA: Diagnosis not present

## 2018-03-17 DIAGNOSIS — N189 Chronic kidney disease, unspecified: Secondary | ICD-10-CM | POA: Diagnosis not present

## 2018-03-17 DIAGNOSIS — M1991 Primary osteoarthritis, unspecified site: Secondary | ICD-10-CM | POA: Diagnosis not present

## 2018-03-17 DIAGNOSIS — I4891 Unspecified atrial fibrillation: Secondary | ICD-10-CM | POA: Diagnosis not present

## 2018-03-17 DIAGNOSIS — M8580 Other specified disorders of bone density and structure, unspecified site: Secondary | ICD-10-CM | POA: Diagnosis not present

## 2018-03-17 DIAGNOSIS — F329 Major depressive disorder, single episode, unspecified: Secondary | ICD-10-CM | POA: Diagnosis not present

## 2018-03-17 DIAGNOSIS — G629 Polyneuropathy, unspecified: Secondary | ICD-10-CM | POA: Diagnosis not present

## 2018-03-17 DIAGNOSIS — F0390 Unspecified dementia without behavioral disturbance: Secondary | ICD-10-CM | POA: Diagnosis not present

## 2018-03-21 DIAGNOSIS — I129 Hypertensive chronic kidney disease with stage 1 through stage 4 chronic kidney disease, or unspecified chronic kidney disease: Secondary | ICD-10-CM | POA: Diagnosis not present

## 2018-03-21 DIAGNOSIS — M1991 Primary osteoarthritis, unspecified site: Secondary | ICD-10-CM | POA: Diagnosis not present

## 2018-03-21 DIAGNOSIS — N189 Chronic kidney disease, unspecified: Secondary | ICD-10-CM | POA: Diagnosis not present

## 2018-03-21 DIAGNOSIS — M8580 Other specified disorders of bone density and structure, unspecified site: Secondary | ICD-10-CM | POA: Diagnosis not present

## 2018-03-21 DIAGNOSIS — G629 Polyneuropathy, unspecified: Secondary | ICD-10-CM | POA: Diagnosis not present

## 2018-03-21 DIAGNOSIS — I4891 Unspecified atrial fibrillation: Secondary | ICD-10-CM | POA: Diagnosis not present

## 2018-03-21 DIAGNOSIS — F329 Major depressive disorder, single episode, unspecified: Secondary | ICD-10-CM | POA: Diagnosis not present

## 2018-03-21 DIAGNOSIS — I2510 Atherosclerotic heart disease of native coronary artery without angina pectoris: Secondary | ICD-10-CM | POA: Diagnosis not present

## 2018-03-21 DIAGNOSIS — F0390 Unspecified dementia without behavioral disturbance: Secondary | ICD-10-CM | POA: Diagnosis not present

## 2018-03-22 DIAGNOSIS — E785 Hyperlipidemia, unspecified: Secondary | ICD-10-CM | POA: Diagnosis not present

## 2018-03-22 DIAGNOSIS — I4891 Unspecified atrial fibrillation: Secondary | ICD-10-CM | POA: Diagnosis not present

## 2018-03-22 DIAGNOSIS — I10 Essential (primary) hypertension: Secondary | ICD-10-CM | POA: Diagnosis not present

## 2018-03-22 DIAGNOSIS — I2510 Atherosclerotic heart disease of native coronary artery without angina pectoris: Secondary | ICD-10-CM | POA: Diagnosis not present

## 2018-03-22 DIAGNOSIS — E039 Hypothyroidism, unspecified: Secondary | ICD-10-CM | POA: Diagnosis not present

## 2018-03-22 DIAGNOSIS — Z23 Encounter for immunization: Secondary | ICD-10-CM | POA: Diagnosis not present

## 2018-03-22 DIAGNOSIS — E559 Vitamin D deficiency, unspecified: Secondary | ICD-10-CM | POA: Diagnosis not present

## 2018-03-22 DIAGNOSIS — F0390 Unspecified dementia without behavioral disturbance: Secondary | ICD-10-CM | POA: Diagnosis not present

## 2018-03-22 DIAGNOSIS — Z1331 Encounter for screening for depression: Secondary | ICD-10-CM | POA: Diagnosis not present

## 2018-03-23 DIAGNOSIS — G629 Polyneuropathy, unspecified: Secondary | ICD-10-CM | POA: Diagnosis not present

## 2018-03-23 DIAGNOSIS — I4891 Unspecified atrial fibrillation: Secondary | ICD-10-CM | POA: Diagnosis not present

## 2018-03-23 DIAGNOSIS — F329 Major depressive disorder, single episode, unspecified: Secondary | ICD-10-CM | POA: Diagnosis not present

## 2018-03-23 DIAGNOSIS — N189 Chronic kidney disease, unspecified: Secondary | ICD-10-CM | POA: Diagnosis not present

## 2018-03-23 DIAGNOSIS — I2510 Atherosclerotic heart disease of native coronary artery without angina pectoris: Secondary | ICD-10-CM | POA: Diagnosis not present

## 2018-03-23 DIAGNOSIS — M1991 Primary osteoarthritis, unspecified site: Secondary | ICD-10-CM | POA: Diagnosis not present

## 2018-03-23 DIAGNOSIS — F0390 Unspecified dementia without behavioral disturbance: Secondary | ICD-10-CM | POA: Diagnosis not present

## 2018-03-23 DIAGNOSIS — I129 Hypertensive chronic kidney disease with stage 1 through stage 4 chronic kidney disease, or unspecified chronic kidney disease: Secondary | ICD-10-CM | POA: Diagnosis not present

## 2018-03-23 DIAGNOSIS — M8580 Other specified disorders of bone density and structure, unspecified site: Secondary | ICD-10-CM | POA: Diagnosis not present

## 2018-07-18 DIAGNOSIS — I2510 Atherosclerotic heart disease of native coronary artery without angina pectoris: Secondary | ICD-10-CM | POA: Diagnosis not present

## 2018-07-18 DIAGNOSIS — E559 Vitamin D deficiency, unspecified: Secondary | ICD-10-CM | POA: Diagnosis not present

## 2018-07-18 DIAGNOSIS — F0390 Unspecified dementia without behavioral disturbance: Secondary | ICD-10-CM | POA: Diagnosis not present

## 2018-07-18 DIAGNOSIS — G629 Polyneuropathy, unspecified: Secondary | ICD-10-CM | POA: Diagnosis not present

## 2018-07-18 DIAGNOSIS — I4891 Unspecified atrial fibrillation: Secondary | ICD-10-CM | POA: Diagnosis not present

## 2018-07-18 DIAGNOSIS — Z1339 Encounter for screening examination for other mental health and behavioral disorders: Secondary | ICD-10-CM | POA: Diagnosis not present

## 2018-07-18 DIAGNOSIS — E039 Hypothyroidism, unspecified: Secondary | ICD-10-CM | POA: Diagnosis not present

## 2018-07-18 DIAGNOSIS — I10 Essential (primary) hypertension: Secondary | ICD-10-CM | POA: Diagnosis not present

## 2018-07-18 DIAGNOSIS — E785 Hyperlipidemia, unspecified: Secondary | ICD-10-CM | POA: Diagnosis not present

## 2018-08-05 DIAGNOSIS — H938X1 Other specified disorders of right ear: Secondary | ICD-10-CM | POA: Diagnosis not present

## 2018-08-05 DIAGNOSIS — Z6839 Body mass index (BMI) 39.0-39.9, adult: Secondary | ICD-10-CM | POA: Diagnosis not present

## 2018-08-14 ENCOUNTER — Other Ambulatory Visit: Payer: Self-pay | Admitting: Neurology

## 2018-09-01 ENCOUNTER — Telehealth: Payer: Self-pay | Admitting: Neurology

## 2018-09-01 NOTE — Telephone Encounter (Signed)
Daughter called and needs to let Dr. Posey Pronto know that she is unable to make her appointments here that it's just too hard to get here. She would like to continue her refills on her medication if possible, but continue to follow up with her PCP Dr. Megan Salon. Please Call. Thanks

## 2018-09-01 NOTE — Telephone Encounter (Signed)
Informed patient's daughter that she can let her mother see Dr. Megan Salon and have her follow up with her gabapentin.

## 2018-09-13 DIAGNOSIS — Z23 Encounter for immunization: Secondary | ICD-10-CM | POA: Diagnosis not present

## 2018-09-21 ENCOUNTER — Ambulatory Visit: Payer: Medicare HMO | Admitting: Neurology

## 2018-11-17 DIAGNOSIS — E039 Hypothyroidism, unspecified: Secondary | ICD-10-CM | POA: Diagnosis not present

## 2018-11-17 DIAGNOSIS — I2510 Atherosclerotic heart disease of native coronary artery without angina pectoris: Secondary | ICD-10-CM | POA: Diagnosis not present

## 2018-11-17 DIAGNOSIS — E785 Hyperlipidemia, unspecified: Secondary | ICD-10-CM | POA: Diagnosis not present

## 2018-11-17 DIAGNOSIS — F0390 Unspecified dementia without behavioral disturbance: Secondary | ICD-10-CM | POA: Diagnosis not present

## 2018-11-17 DIAGNOSIS — Z139 Encounter for screening, unspecified: Secondary | ICD-10-CM | POA: Diagnosis not present

## 2018-11-17 DIAGNOSIS — I4891 Unspecified atrial fibrillation: Secondary | ICD-10-CM | POA: Diagnosis not present

## 2018-11-17 DIAGNOSIS — E559 Vitamin D deficiency, unspecified: Secondary | ICD-10-CM | POA: Diagnosis not present

## 2018-11-17 DIAGNOSIS — I10 Essential (primary) hypertension: Secondary | ICD-10-CM | POA: Diagnosis not present

## 2018-11-17 DIAGNOSIS — G629 Polyneuropathy, unspecified: Secondary | ICD-10-CM | POA: Diagnosis not present

## 2019-05-07 ENCOUNTER — Other Ambulatory Visit: Payer: Self-pay | Admitting: Neurology

## 2019-05-11 DIAGNOSIS — I4891 Unspecified atrial fibrillation: Secondary | ICD-10-CM | POA: Diagnosis not present

## 2019-05-11 DIAGNOSIS — R739 Hyperglycemia, unspecified: Secondary | ICD-10-CM | POA: Diagnosis not present

## 2019-05-11 DIAGNOSIS — E559 Vitamin D deficiency, unspecified: Secondary | ICD-10-CM | POA: Diagnosis not present

## 2019-05-11 DIAGNOSIS — E039 Hypothyroidism, unspecified: Secondary | ICD-10-CM | POA: Diagnosis not present

## 2019-05-11 DIAGNOSIS — Z1331 Encounter for screening for depression: Secondary | ICD-10-CM | POA: Diagnosis not present

## 2019-05-11 DIAGNOSIS — I2510 Atherosclerotic heart disease of native coronary artery without angina pectoris: Secondary | ICD-10-CM | POA: Diagnosis not present

## 2019-05-11 DIAGNOSIS — E785 Hyperlipidemia, unspecified: Secondary | ICD-10-CM | POA: Diagnosis not present

## 2019-05-11 DIAGNOSIS — Z9181 History of falling: Secondary | ICD-10-CM | POA: Diagnosis not present

## 2019-05-11 DIAGNOSIS — F0390 Unspecified dementia without behavioral disturbance: Secondary | ICD-10-CM | POA: Diagnosis not present

## 2019-05-11 DIAGNOSIS — I10 Essential (primary) hypertension: Secondary | ICD-10-CM | POA: Diagnosis not present

## 2019-09-20 DIAGNOSIS — Z23 Encounter for immunization: Secondary | ICD-10-CM | POA: Diagnosis not present

## 2019-09-20 DIAGNOSIS — R739 Hyperglycemia, unspecified: Secondary | ICD-10-CM | POA: Diagnosis not present

## 2019-09-20 DIAGNOSIS — E039 Hypothyroidism, unspecified: Secondary | ICD-10-CM | POA: Diagnosis not present

## 2019-09-20 DIAGNOSIS — Z9181 History of falling: Secondary | ICD-10-CM | POA: Diagnosis not present

## 2019-09-20 DIAGNOSIS — I10 Essential (primary) hypertension: Secondary | ICD-10-CM | POA: Diagnosis not present

## 2019-09-20 DIAGNOSIS — I4891 Unspecified atrial fibrillation: Secondary | ICD-10-CM | POA: Diagnosis not present

## 2019-09-20 DIAGNOSIS — Z0000 Encounter for general adult medical examination without abnormal findings: Secondary | ICD-10-CM | POA: Diagnosis not present

## 2019-09-20 DIAGNOSIS — E785 Hyperlipidemia, unspecified: Secondary | ICD-10-CM | POA: Diagnosis not present

## 2019-09-20 DIAGNOSIS — Z6839 Body mass index (BMI) 39.0-39.9, adult: Secondary | ICD-10-CM | POA: Diagnosis not present

## 2019-09-20 DIAGNOSIS — E559 Vitamin D deficiency, unspecified: Secondary | ICD-10-CM | POA: Diagnosis not present

## 2019-09-20 DIAGNOSIS — I2510 Atherosclerotic heart disease of native coronary artery without angina pectoris: Secondary | ICD-10-CM | POA: Diagnosis not present

## 2020-01-22 DIAGNOSIS — I4891 Unspecified atrial fibrillation: Secondary | ICD-10-CM | POA: Diagnosis not present

## 2020-01-22 DIAGNOSIS — E559 Vitamin D deficiency, unspecified: Secondary | ICD-10-CM | POA: Diagnosis not present

## 2020-01-22 DIAGNOSIS — G629 Polyneuropathy, unspecified: Secondary | ICD-10-CM | POA: Diagnosis not present

## 2020-01-22 DIAGNOSIS — R739 Hyperglycemia, unspecified: Secondary | ICD-10-CM | POA: Diagnosis not present

## 2020-01-22 DIAGNOSIS — Z139 Encounter for screening, unspecified: Secondary | ICD-10-CM | POA: Diagnosis not present

## 2020-01-22 DIAGNOSIS — I10 Essential (primary) hypertension: Secondary | ICD-10-CM | POA: Diagnosis not present

## 2020-01-22 DIAGNOSIS — I2510 Atherosclerotic heart disease of native coronary artery without angina pectoris: Secondary | ICD-10-CM | POA: Diagnosis not present

## 2020-01-22 DIAGNOSIS — E785 Hyperlipidemia, unspecified: Secondary | ICD-10-CM | POA: Diagnosis not present

## 2020-01-22 DIAGNOSIS — E039 Hypothyroidism, unspecified: Secondary | ICD-10-CM | POA: Diagnosis not present

## 2020-01-22 DIAGNOSIS — F0390 Unspecified dementia without behavioral disturbance: Secondary | ICD-10-CM | POA: Diagnosis not present

## 2020-01-22 DIAGNOSIS — Z79899 Other long term (current) drug therapy: Secondary | ICD-10-CM | POA: Diagnosis not present

## 2020-02-22 DIAGNOSIS — J189 Pneumonia, unspecified organism: Secondary | ICD-10-CM | POA: Diagnosis not present

## 2020-02-22 DIAGNOSIS — I10 Essential (primary) hypertension: Secondary | ICD-10-CM | POA: Diagnosis not present

## 2020-02-22 DIAGNOSIS — J181 Lobar pneumonia, unspecified organism: Secondary | ICD-10-CM | POA: Diagnosis not present

## 2020-02-22 DIAGNOSIS — R531 Weakness: Secondary | ICD-10-CM | POA: Diagnosis not present

## 2020-02-22 DIAGNOSIS — R918 Other nonspecific abnormal finding of lung field: Secondary | ICD-10-CM | POA: Diagnosis not present

## 2020-02-22 DIAGNOSIS — R0600 Dyspnea, unspecified: Secondary | ICD-10-CM | POA: Diagnosis not present

## 2020-02-22 DIAGNOSIS — I4891 Unspecified atrial fibrillation: Secondary | ICD-10-CM | POA: Diagnosis not present

## 2020-02-22 DIAGNOSIS — R0602 Shortness of breath: Secondary | ICD-10-CM | POA: Diagnosis not present

## 2020-02-22 DIAGNOSIS — I361 Nonrheumatic tricuspid (valve) insufficiency: Secondary | ICD-10-CM | POA: Diagnosis not present

## 2020-02-22 DIAGNOSIS — E039 Hypothyroidism, unspecified: Secondary | ICD-10-CM | POA: Diagnosis not present

## 2020-02-22 DIAGNOSIS — D62 Acute posthemorrhagic anemia: Secondary | ICD-10-CM | POA: Diagnosis not present

## 2020-02-22 DIAGNOSIS — R0609 Other forms of dyspnea: Secondary | ICD-10-CM | POA: Diagnosis not present

## 2020-02-22 DIAGNOSIS — K921 Melena: Secondary | ICD-10-CM | POA: Diagnosis not present

## 2020-02-22 DIAGNOSIS — I480 Paroxysmal atrial fibrillation: Secondary | ICD-10-CM | POA: Diagnosis not present

## 2020-02-22 DIAGNOSIS — I110 Hypertensive heart disease with heart failure: Secondary | ICD-10-CM | POA: Diagnosis not present

## 2020-02-22 DIAGNOSIS — E785 Hyperlipidemia, unspecified: Secondary | ICD-10-CM | POA: Diagnosis not present

## 2020-02-22 DIAGNOSIS — R0902 Hypoxemia: Secondary | ICD-10-CM | POA: Diagnosis not present

## 2020-02-22 DIAGNOSIS — Z7902 Long term (current) use of antithrombotics/antiplatelets: Secondary | ICD-10-CM | POA: Diagnosis not present

## 2020-02-22 DIAGNOSIS — I5022 Chronic systolic (congestive) heart failure: Secondary | ICD-10-CM | POA: Diagnosis not present

## 2020-02-22 DIAGNOSIS — R42 Dizziness and giddiness: Secondary | ICD-10-CM | POA: Diagnosis not present

## 2020-02-22 DIAGNOSIS — R9389 Abnormal findings on diagnostic imaging of other specified body structures: Secondary | ICD-10-CM | POA: Diagnosis not present

## 2020-02-22 DIAGNOSIS — I340 Nonrheumatic mitral (valve) insufficiency: Secondary | ICD-10-CM | POA: Diagnosis not present

## 2020-02-24 ENCOUNTER — Inpatient Hospital Stay (HOSPITAL_COMMUNITY)
Admission: AD | Admit: 2020-02-24 | Discharge: 2020-03-05 | DRG: 377 | Disposition: A | Discharge status: Skilled Nursing Facility | Payer: Medicare HMO | Source: Other Acute Inpatient Hospital | Attending: Internal Medicine | Admitting: Internal Medicine

## 2020-02-24 ENCOUNTER — Other Ambulatory Visit: Payer: Self-pay

## 2020-02-24 ENCOUNTER — Encounter (HOSPITAL_COMMUNITY): Payer: Self-pay | Admitting: Internal Medicine

## 2020-02-24 DIAGNOSIS — D62 Acute posthemorrhagic anemia: Secondary | ICD-10-CM | POA: Diagnosis not present

## 2020-02-24 DIAGNOSIS — Z86718 Personal history of other venous thrombosis and embolism: Secondary | ICD-10-CM | POA: Diagnosis not present

## 2020-02-24 DIAGNOSIS — K5730 Diverticulosis of large intestine without perforation or abscess without bleeding: Secondary | ICD-10-CM | POA: Diagnosis not present

## 2020-02-24 DIAGNOSIS — K2971 Gastritis, unspecified, with bleeding: Secondary | ICD-10-CM | POA: Diagnosis not present

## 2020-02-24 DIAGNOSIS — R195 Other fecal abnormalities: Secondary | ICD-10-CM | POA: Diagnosis not present

## 2020-02-24 DIAGNOSIS — G9340 Encephalopathy, unspecified: Secondary | ICD-10-CM | POA: Diagnosis not present

## 2020-02-24 DIAGNOSIS — E039 Hypothyroidism, unspecified: Secondary | ICD-10-CM | POA: Diagnosis not present

## 2020-02-24 DIAGNOSIS — K921 Melena: Secondary | ICD-10-CM | POA: Diagnosis not present

## 2020-02-24 DIAGNOSIS — Z823 Family history of stroke: Secondary | ICD-10-CM

## 2020-02-24 DIAGNOSIS — I5020 Unspecified systolic (congestive) heart failure: Secondary | ICD-10-CM | POA: Diagnosis not present

## 2020-02-24 DIAGNOSIS — I110 Hypertensive heart disease with heart failure: Secondary | ICD-10-CM | POA: Diagnosis not present

## 2020-02-24 DIAGNOSIS — Z20822 Contact with and (suspected) exposure to covid-19: Secondary | ICD-10-CM | POA: Diagnosis not present

## 2020-02-24 DIAGNOSIS — D125 Benign neoplasm of sigmoid colon: Secondary | ICD-10-CM

## 2020-02-24 DIAGNOSIS — Z79899 Other long term (current) drug therapy: Secondary | ICD-10-CM

## 2020-02-24 DIAGNOSIS — J9601 Acute respiratory failure with hypoxia: Secondary | ICD-10-CM | POA: Diagnosis present

## 2020-02-24 DIAGNOSIS — D500 Iron deficiency anemia secondary to blood loss (chronic): Secondary | ICD-10-CM | POA: Diagnosis not present

## 2020-02-24 DIAGNOSIS — I5022 Chronic systolic (congestive) heart failure: Secondary | ICD-10-CM | POA: Diagnosis not present

## 2020-02-24 DIAGNOSIS — Z515 Encounter for palliative care: Secondary | ICD-10-CM | POA: Diagnosis not present

## 2020-02-24 DIAGNOSIS — E785 Hyperlipidemia, unspecified: Secondary | ICD-10-CM | POA: Diagnosis present

## 2020-02-24 DIAGNOSIS — Z8673 Personal history of transient ischemic attack (TIA), and cerebral infarction without residual deficits: Secondary | ICD-10-CM

## 2020-02-24 DIAGNOSIS — R42 Dizziness and giddiness: Secondary | ICD-10-CM | POA: Diagnosis present

## 2020-02-24 DIAGNOSIS — K922 Gastrointestinal hemorrhage, unspecified: Secondary | ICD-10-CM | POA: Diagnosis present

## 2020-02-24 DIAGNOSIS — I340 Nonrheumatic mitral (valve) insufficiency: Secondary | ICD-10-CM | POA: Diagnosis present

## 2020-02-24 DIAGNOSIS — Z79890 Hormone replacement therapy (postmenopausal): Secondary | ICD-10-CM

## 2020-02-24 DIAGNOSIS — D649 Anemia, unspecified: Secondary | ICD-10-CM | POA: Diagnosis present

## 2020-02-24 DIAGNOSIS — I499 Cardiac arrhythmia, unspecified: Secondary | ICD-10-CM | POA: Diagnosis not present

## 2020-02-24 DIAGNOSIS — Z90710 Acquired absence of both cervix and uterus: Secondary | ICD-10-CM

## 2020-02-24 DIAGNOSIS — M2550 Pain in unspecified joint: Secondary | ICD-10-CM | POA: Diagnosis not present

## 2020-02-24 DIAGNOSIS — I2510 Atherosclerotic heart disease of native coronary artery without angina pectoris: Secondary | ICD-10-CM | POA: Diagnosis present

## 2020-02-24 DIAGNOSIS — K5731 Diverticulosis of large intestine without perforation or abscess with bleeding: Secondary | ICD-10-CM | POA: Diagnosis not present

## 2020-02-24 DIAGNOSIS — Z8542 Personal history of malignant neoplasm of other parts of uterus: Secondary | ICD-10-CM

## 2020-02-24 DIAGNOSIS — Z7901 Long term (current) use of anticoagulants: Secondary | ICD-10-CM

## 2020-02-24 DIAGNOSIS — Z7401 Bed confinement status: Secondary | ICD-10-CM | POA: Diagnosis not present

## 2020-02-24 DIAGNOSIS — I5021 Acute systolic (congestive) heart failure: Secondary | ICD-10-CM | POA: Diagnosis not present

## 2020-02-24 DIAGNOSIS — E860 Dehydration: Secondary | ICD-10-CM | POA: Diagnosis present

## 2020-02-24 DIAGNOSIS — R58 Hemorrhage, not elsewhere classified: Secondary | ICD-10-CM | POA: Diagnosis not present

## 2020-02-24 DIAGNOSIS — R000 Tachycardia, unspecified: Secondary | ICD-10-CM | POA: Diagnosis present

## 2020-02-24 DIAGNOSIS — R002 Palpitations: Secondary | ICD-10-CM | POA: Diagnosis present

## 2020-02-24 DIAGNOSIS — Z803 Family history of malignant neoplasm of breast: Secondary | ICD-10-CM

## 2020-02-24 DIAGNOSIS — I5023 Acute on chronic systolic (congestive) heart failure: Secondary | ICD-10-CM | POA: Diagnosis present

## 2020-02-24 DIAGNOSIS — I480 Paroxysmal atrial fibrillation: Secondary | ICD-10-CM | POA: Diagnosis present

## 2020-02-24 DIAGNOSIS — K5521 Angiodysplasia of colon with hemorrhage: Principal | ICD-10-CM

## 2020-02-24 DIAGNOSIS — R0902 Hypoxemia: Secondary | ICD-10-CM | POA: Diagnosis not present

## 2020-02-24 DIAGNOSIS — F0390 Unspecified dementia without behavioral disturbance: Secondary | ICD-10-CM | POA: Diagnosis not present

## 2020-02-24 DIAGNOSIS — Z7902 Long term (current) use of antithrombotics/antiplatelets: Secondary | ICD-10-CM | POA: Diagnosis not present

## 2020-02-24 DIAGNOSIS — Z8619 Personal history of other infectious and parasitic diseases: Secondary | ICD-10-CM

## 2020-02-24 DIAGNOSIS — F0150 Vascular dementia without behavioral disturbance: Secondary | ICD-10-CM | POA: Diagnosis not present

## 2020-02-24 DIAGNOSIS — R531 Weakness: Secondary | ICD-10-CM | POA: Diagnosis present

## 2020-02-24 DIAGNOSIS — K635 Polyp of colon: Secondary | ICD-10-CM | POA: Diagnosis not present

## 2020-02-24 DIAGNOSIS — R52 Pain, unspecified: Secondary | ICD-10-CM | POA: Diagnosis not present

## 2020-02-24 DIAGNOSIS — M1990 Unspecified osteoarthritis, unspecified site: Secondary | ICD-10-CM | POA: Diagnosis present

## 2020-02-24 DIAGNOSIS — E870 Hyperosmolality and hypernatremia: Secondary | ICD-10-CM | POA: Diagnosis present

## 2020-02-24 DIAGNOSIS — Z6841 Body Mass Index (BMI) 40.0 and over, adult: Secondary | ICD-10-CM

## 2020-02-24 DIAGNOSIS — Z87891 Personal history of nicotine dependence: Secondary | ICD-10-CM

## 2020-02-24 DIAGNOSIS — J189 Pneumonia, unspecified organism: Secondary | ICD-10-CM | POA: Diagnosis not present

## 2020-02-24 LAB — CBC
HCT: 30.3 % — ABNORMAL LOW (ref 36.0–46.0)
Hemoglobin: 9.4 g/dL — ABNORMAL LOW (ref 12.0–15.0)
MCH: 32.2 pg (ref 26.0–34.0)
MCHC: 31 g/dL (ref 30.0–36.0)
MCV: 103.8 fL — ABNORMAL HIGH (ref 80.0–100.0)
Platelets: 250 10*3/uL (ref 150–400)
RBC: 2.92 MIL/uL — ABNORMAL LOW (ref 3.87–5.11)
RDW: 14.2 % (ref 11.5–15.5)
WBC: 6.2 10*3/uL (ref 4.0–10.5)
nRBC: 0 % (ref 0.0–0.2)

## 2020-02-24 MED ORDER — METOPROLOL TARTRATE 50 MG PO TABS: 50.0000 mg | ORAL_TABLET | Freq: Two times a day (BID) | ORAL | Status: DC

## 2020-02-24 MED ORDER — PANTOPRAZOLE SODIUM 40 MG IV SOLR
40.0000 mg | INTRAVENOUS | Status: DC
  Administered 2020-02-25: 40 mg via INTRAVENOUS
  Filled 2020-02-24: qty 40

## 2020-02-24 MED ORDER — FUROSEMIDE 20 MG PO TABS
20.0000 mg | ORAL_TABLET | Freq: Every day | ORAL | Status: DC
  Administered 2020-02-25: 20 mg via ORAL
  Filled 2020-02-24: qty 1

## 2020-02-24 MED ORDER — MEMANTINE HCL 10 MG PO TABS
10.0000 mg | ORAL_TABLET | Freq: Every day | ORAL | Status: DC
  Administered 2020-02-24 – 2020-03-05 (×11): 10 mg via ORAL
  Filled 2020-02-24 (×4): qty 1
  Filled 2020-02-24: qty 2
  Filled 2020-02-24 (×3): qty 1
  Filled 2020-02-24 (×2): qty 2
  Filled 2020-02-24: qty 1

## 2020-02-24 MED ORDER — ACETAMINOPHEN 650 MG RE SUPP: 650.0000 mg | Freq: Four times a day (QID) | RECTAL | Status: DC | PRN

## 2020-02-24 MED ORDER — POLYETHYLENE GLYCOL 3350 17 G PO PACK: 17.0000 g | PACK | Freq: Every day | ORAL | Status: DC | PRN

## 2020-02-24 MED ORDER — LEVOTHYROXINE SODIUM 25 MCG PO TABS
25.0000 ug | ORAL_TABLET | Freq: Every day | ORAL | Status: DC
  Administered 2020-02-25 – 2020-03-05 (×10): 25 ug via ORAL
  Filled 2020-02-24 (×10): qty 1

## 2020-02-24 MED ORDER — PRAVASTATIN SODIUM 20 MG PO TABS
20.0000 mg | ORAL_TABLET | Freq: Every day | ORAL | Status: DC
  Administered 2020-02-24 – 2020-03-05 (×11): 20 mg via ORAL
  Filled 2020-02-24 (×11): qty 1

## 2020-02-24 MED ORDER — ONDANSETRON HCL 4 MG/2ML IJ SOLN
4.0000 mg | Freq: Four times a day (QID) | INTRAMUSCULAR | Status: DC | PRN
  Administered 2020-03-02: 10:00:00 4 mg via INTRAVENOUS
  Filled 2020-02-24: qty 2

## 2020-02-24 MED ORDER — ACETAMINOPHEN 325 MG PO TABS
650.0000 mg | ORAL_TABLET | Freq: Four times a day (QID) | ORAL | Status: DC | PRN
  Administered 2020-02-24 – 2020-03-05 (×10): 650 mg via ORAL
  Filled 2020-02-24 (×10): qty 2

## 2020-02-24 MED ORDER — ONDANSETRON HCL 4 MG PO TABS
4.0000 mg | ORAL_TABLET | Freq: Four times a day (QID) | ORAL | Status: DC | PRN
  Administered 2020-02-26: 4 mg via ORAL
  Filled 2020-02-24: qty 1

## 2020-02-24 NOTE — H&P (Signed)
History and Physical  Connie Baker [id number] DOB: [birth year] DOA: 02/24/2020   PCP: Helen Hashimoto., MD   Patient coming from: [facility] Transfer   Chief Complaint: SOB, Anemia  HPI: Connie Baker is a 86 y.o. female with medical history significant for paroxysmal atrial fibrillation, hyperlipidemia, hypothyroidism who was admitted to [facility] 11/13 with complaints of weakness, hypoxemia, dizziness for 1 week found to have acute anemia.  Her baseline hemoglobin is 13, at the end of admission to that facility she was found to have a hemoglobin of 9.0.  She had heme positive stool at the facility as well after admission.  Hemoglobin fell further today to 8.1 by report, she was given 1 unit of blood and plan of care was discussed with her son, who agreed to transfer to this facility today for GI evaluation, as gastroenterology specialist is not available at [facility] this week.  Patient is somewhat confused, not able to give me much of a history tells me that she has never had GI bleeding or any bleeding complications before, does not think she has ever had an upper endoscopy.  In the interim Xarelto has been stopped, she has been started on Protonix 40 mg IV q.24 hours, hemoglobin recheck is pending and the patient is hemodynamically stable. Because patient presented with unexplained hypoxemia with underlying atrial fibrillation an echocardiogram was obtained.  This revealed a new finding of mild systolic heart failure physiology with an EF of 40-45% in the context of moderate to severe mitral regurgitation.  Of note in 2017 patient had a hyperdynamic ejection fraction of 65% on Lexiscan stress test.  Current clinical picture not consistent with heart failure noting normal chest x-ray and normal BNP.   Review of Systems: Please see HPI for pertinent positives and negatives. A complete 10 system review of systems are otherwise negative.  Past Medical History:  Diagnosis Date    . Atrial fibrillation ([place])   . CAD (coronary artery disease)   . Cancer [facility])    uterine cancer  . H/O: GI bleed   . History of shingles   . Hyperlipidemia   . Hypertension   . Osteoarthritis   . Stroke ([place])   . Thromboembolus ([place])    Left femoral -tibial artery   Past Surgical History:  Procedure Laterality Date  . ABDOMINAL HYSTERECTOMY    . APPENDECTOMY    . THROMBOEMBOLECTOMY  05/30/11   Left femoral-popliteal artery   . TONSILLECTOMY      Social History:  reports that she quit smoking about 44 years ago. Her smoking use included cigarettes. She has never used smokeless tobacco. She reports that she does not drink alcohol or use drugs.   Allergies  Allergen Reactions  . Sulfa Antibiotics     Family History  Problem Relation Age of Onset  . Breast cancer Mother   . Stroke Father      Prior to Admission medications   Medication Sig Start Date End Date Taking? Authorizing Provider  B Complex Vitamins (VITAMIN-B COMPLEX) TABS Take by mouth.      [provider]  Cholecalciferol (VITAMIN D PO) Take by mouth.    [provider]  Cranberry 500 MG CAPS Take by mouth.    [provider]  furosemide (LASIX) 20 MG tablet Take 20 mg by mouth.    [provider]  gabapentin (NEURONTIN) 300 MG capsule TAKE 2 CAPSULES BY MOUTH IN THE MORNING, THEN TAKE 1 CAPSULE IN THE AFTERNOON, THEN  TAKE 1 CAPSULE AT BEDTIME 08/16/18   Narda Amber K, DO  levothyroxine (SYNTHROID, LEVOTHROID) 25 MCG tablet  08/07/17   [provider]  memantine (NAMENDA) 10 MG tablet  07/16/17   [provider]  metoprolol tartrate (LOPRESSOR) 50 MG tablet  07/13/17   [provider]  Multiple Vitamins-Minerals (MULTI FOR HER PO) Take by mouth.    [provider]  pravastatin (PRAVACHOL) 20 MG tablet  06/01/17   [provider]  Alveda Reasons 20 MG TABS tablet  08/11/17   [provider]    Physical Exam: BP (!) 144/83 (BP  Location: Right Arm)   Pulse (!) 102   Temp 98.5 F (36.9 C) (Oral)   Resp (!) 22   SpO2 98%   General:  Alert, oriented to self and place, calm, in no acute distress  Eyes: EOMI, clear conjuctivae, white sclerea Neck: supple, no masses, trachea mildline  Cardiovascular: RRR, no murmurs or rubs, no peripheral edema  Respiratory: clear to auscultation bilaterally, no wheezes, no crackles  Abdomen: soft, nontender, nondistended, normal bowel tones heard  Skin: dry, no rashes  Musculoskeletal: no joint effusions, normal range of motion  Psychiatric: appropriate affect, normal speech  Neurologic: extraocular muscles intact, clear speech, moving all extremities with intact sensorium            Labs on Admission:  Basic Metabolic Panel: No results for input(s): NA, K, CL, CO2, GLUCOSE, BUN, CREATININE, CALCIUM, MG, PHOS in the last 168 hours. Liver Function Tests: No results for input(s): AST, ALT, ALKPHOS, BILITOT, PROT, ALBUMIN in the last 168 hours. No results for input(s): LIPASE, AMYLASE in the last 168 hours. No results for input(s): AMMONIA in the last 168 hours. CBC: No results for input(s): WBC, NEUTROABS, HGB, HCT, MCV, PLT in the last 168 hours. Cardiac Enzymes: No results for input(s): CKTOTAL, CKMB, CKMBINDEX, TROPONINI in the last 168 hours.  BNP (last 3 results) No results for input(s): BNP in the last 8760 hours.  ProBNP (last 3 results) No results for input(s): PROBNP in the last 8760 hours.  CBG: No results for input(s): GLUCAP in the last 168 hours.  Radiological Exams on Admission: No results found.  Assessment/Plan Present on Admission: . Anemia  Active Problems:   Anemia  Symptomatic Anemia/heme-positive stool Last known hemoglobin around 13 with presenting hemoglobin 9.6.  Has drifted down to 9.0 since admission Had black tarry stool that was heme-positive prompting discontinuation of pre-admission Xarelto Needs formal GI evaluation discussed  with on-call GI Dr. Silverio Decamp, they will see the patient in the morning. I have made the patient n.p.o. after midnight in anticipation of possible EGD Begin IV Protonix 40 mg q.24hours Patient reports short duration of dyspnea on exertion symptoms for about 1 week TSH normal, iron was normal at 41, TIBC 295,% sat 13.8, ferritin 42.8, folate > 20, B12 631.  Acute Hypoxia Occurs only with exertion and likely related to symptomatic anemia Follow-up on CT chest ordered by EDP--this has been completed and shows no explanation for patient's hypoxemia Continue supportive care with oxygen Echocardiogram reveals an EF of 45-50%, unable to determine if underlying diastolic dysfunction, apically akinesis with global hypokinesis, mild pulmonary hypertension 40 mmhg, moderate to severe MR-of note patient without signs of acute heart failure exacerbation on exam  New finding systolic heart failure with EF 40-45% with moderate to severe MR on echocardiogram No clinical findings of heart failure on exam-patient does have a history of chronic lower extremity edema BNP 816  with normal chest x-ray Do not suspect hypoxemia related to acute heart failure Of note patient does have regional wall motion abnormalities on echo with apical akinesis and global hypokinesis  Recurrent atrial fibrillation Patient with history of PAF in 2017  Patient has reported issues with some palpitations for several years Telemetry monitoring Remains rate controlled on beta-blocker CHAD-VASc=7 (female, age, CHF, HTN, h/o DVT) Anticoagulation on hold as above  Hypertension Prior to discharge BP dropped to 98/53 therefore metoprolol placed on hold Hold Lasix-previous echo with preserved LVEF and no evidence of diastolic dysfunction-patient does have brawny skin changes in legs and suspect she has been given Lasix to manage lower extremity edema independent of heart failure  Hypothyroidism Continue Synthroid TSH normal  History  of DVT on chronic anticoagulation Xarelto on hold as above  Dementia Continue memantine for now Fall precautions  DVT prophylaxis: SCDs  Code Status: Full code  Family Communication: No family present  Disposition Plan: Likely home at DC.  Consults called: [facility] Dr. Silverio Decamp  Admission status:    Time spent: 45 minutes  Connie Villafranca Marry Guan MD [facility] Pager [phone]  If 7PM-7AM, please contact night-coverage [email or web address] Password [facility]  02/24/2020, 4:50 PM

## 2020-02-24 NOTE — Progress Notes (Signed)
RN called pt's son to obtain admission history due to pt only being A&Ox 2. RN also updated son on the pt's plan of care.

## 2020-02-25 DIAGNOSIS — I480 Paroxysmal atrial fibrillation: Secondary | ICD-10-CM | POA: Diagnosis present

## 2020-02-25 DIAGNOSIS — F0390 Unspecified dementia without behavioral disturbance: Secondary | ICD-10-CM | POA: Diagnosis present

## 2020-02-25 DIAGNOSIS — I5021 Acute systolic (congestive) heart failure: Secondary | ICD-10-CM | POA: Diagnosis present

## 2020-02-25 DIAGNOSIS — R195 Other fecal abnormalities: Secondary | ICD-10-CM

## 2020-02-25 DIAGNOSIS — K922 Gastrointestinal hemorrhage, unspecified: Secondary | ICD-10-CM | POA: Diagnosis present

## 2020-02-25 DIAGNOSIS — E785 Hyperlipidemia, unspecified: Secondary | ICD-10-CM | POA: Diagnosis present

## 2020-02-25 DIAGNOSIS — K2971 Gastritis, unspecified, with bleeding: Secondary | ICD-10-CM

## 2020-02-25 DIAGNOSIS — E039 Hypothyroidism, unspecified: Secondary | ICD-10-CM | POA: Diagnosis present

## 2020-02-25 DIAGNOSIS — J9601 Acute respiratory failure with hypoxia: Secondary | ICD-10-CM | POA: Diagnosis present

## 2020-02-25 DIAGNOSIS — D500 Iron deficiency anemia secondary to blood loss (chronic): Secondary | ICD-10-CM

## 2020-02-25 LAB — ABO/RH: ABO/RH(D): O POS

## 2020-02-25 LAB — BASIC METABOLIC PANEL
Anion gap: 9 (ref 5–15)
BUN: 24 mg/dL — ABNORMAL HIGH (ref 8–23)
CO2: 26 mmol/L (ref 22–32)
Calcium: 8.1 mg/dL — ABNORMAL LOW (ref 8.9–10.3)
Chloride: 109 mmol/L (ref 98–111)
Creatinine, Ser: 0.85 mg/dL (ref 0.44–1.00)
GFR calc Af Amer: >60 mL/min (ref >60)
GFR calc non Af Amer: >60 mL/min (ref >60)
Glucose, Bld: 98 mg/dL (ref 70–99)
Potassium: 4.2 mmol/L (ref 3.5–5.1)
Sodium: 144 mmol/L (ref 135–145)

## 2020-02-25 LAB — CBC
HCT: 28.6 % — ABNORMAL LOW (ref 36.0–46.0)
Hemoglobin: 8.8 g/dL — ABNORMAL LOW (ref 12.0–15.0)
MCH: 32.2 pg (ref 26.0–34.0)
MCHC: 30.8 g/dL (ref 30.0–36.0)
MCV: 104.8 fL — ABNORMAL HIGH (ref 80.0–100.0)
Platelets: 228 10*3/uL (ref 150–400)
RBC: 2.73 MIL/uL — ABNORMAL LOW (ref 3.87–5.11)
RDW: 14.2 % (ref 11.5–15.5)
WBC: 6.5 10*3/uL (ref 4.0–10.5)
nRBC: 0 % (ref 0.0–0.2)

## 2020-02-25 LAB — TYPE AND SCREEN
ABO/RH(D): O POS
Antibody Screen: NEGATIVE
Unit division: 0

## 2020-02-25 LAB — HEMOGLOBIN AND HEMATOCRIT, BLOOD
HCT: 31.5 % — ABNORMAL LOW (ref 36.0–46.0)
Hemoglobin: 10.1 g/dL — ABNORMAL LOW (ref 12.0–15.0)

## 2020-02-25 LAB — BPAM RBC
Blood Product Expiration Date: 202104112359
Unit Type and Rh: 5100

## 2020-02-25 LAB — PREPARE RBC (CROSSMATCH)

## 2020-02-25 LAB — MRSA PCR SCREENING: MRSA by PCR: NEGATIVE

## 2020-02-25 MED ORDER — SODIUM CHLORIDE 0.9 % IV SOLN: INTRAVENOUS | Status: DC

## 2020-02-25 MED ORDER — PANTOPRAZOLE SODIUM 40 MG IV SOLR
40.0000 mg | Freq: Two times a day (BID) | INTRAVENOUS | Status: DC
  Administered 2020-02-25 – 2020-02-27 (×4): 40 mg via INTRAVENOUS
  Filled 2020-02-25 (×4): qty 40

## 2020-02-25 MED ORDER — METOPROLOL TARTRATE 5 MG/5ML IV SOLN
5.0000 mg | Freq: Once | INTRAVENOUS | Status: AC
  Administered 2020-02-25: 22:00:00 5 mg via INTRAVENOUS
  Filled 2020-02-25: qty 5

## 2020-02-25 MED ORDER — CHLORHEXIDINE GLUCONATE CLOTH 2 % EX PADS
6.0000 | MEDICATED_PAD | Freq: Every day | CUTANEOUS | Status: DC
  Administered 2020-02-25 – 2020-02-28 (×4): 6 via TOPICAL

## 2020-02-25 MED ORDER — PEG-KCL-NACL-NASULF-NA ASC-C 100 G PO SOLR
1.0000 | Freq: Once | ORAL | Status: AC
  Administered 2020-02-25: 200 g via ORAL
  Filled 2020-02-25: qty 1

## 2020-02-25 MED ORDER — SODIUM CHLORIDE 0.9% IV SOLUTION: Freq: Once | INTRAVENOUS | Status: DC

## 2020-02-25 NOTE — Progress Notes (Signed)
RN lisa paged and informed me that patient is questioning the bowel prep and the need for procedures. She told RN that at her age she didn't think she should go through procedures and the lord will take care of her.  Its different from the discussion we had with patient and her family. Ok to hold off bowel prep for now Will need to discuss with patient and family goals of care, please consult palliative care.

## 2020-02-25 NOTE — Progress Notes (Signed)
During administration of patient's medication, she verbalized she does not feel like herself (states she feels like a dog) and she doesn't see the need to go through these things since she is 36. She shared thoughts about her age and that "nature should just take its course". Writer asked patient if she had shared her thoughts and feelings with her children. Encouraged patient to share these thoughts and feelings with her daughter today when she comes to visit.   Patient was able to tolerated medications crushed in a spoonful of applesauce. Tylenol given for management of back pain. Patient is sitting upright in bed eating a clear liquid tray consisting of chicken broth, jello, tea, cranberry juice, and a lemon ice frozen dessert.   Patient reports feeling a little nausea but denies that she feels the need to vomit. Head of bed is elevated. Call bell within reach. Will continue to monitor.

## 2020-02-25 NOTE — Progress Notes (Signed)
Assessed patient after receiving call from telemetry that patient's had increased to 127. While assessing patient noted large amount of red blood clots on bed pad. Notified charge nurse and attending physician. New orders were given.

## 2020-02-25 NOTE — Progress Notes (Signed)
Advised by NT and other staff nurse that patient is still bleeding from rectum. Patient care provided. Attending physician notified. New orders given.

## 2020-02-25 NOTE — Progress Notes (Signed)
Spoke with night coverage MD Humphrey Rolls and he said to not give the 1 unit of PRBC at this time due to pts hemoglobin being 10.1 at 1915. He said we will redraw H&H in the AM and transfuse if less than 7.

## 2020-02-25 NOTE — Progress Notes (Addendum)
[facility]                                                                              Patient Demographics  Connie Baker, is a 86 y.o. female, DOB - [birth year], [id number]  Admit date - 02/24/2020   Admitting Physician Mir Marry Guan, MD  Outpatient Primary MD for the patient is Helen Hashimoto., MD  Outpatient specialists:   LOS - 1  days   Medical records reviewed and are as summarized below:    No chief complaint on file.      Brief summary   Patient is a 86 year old female with history of paroxysmal A. fib, hyperlipidemia, hypothyroidism who was admitted to [facility] on 11/13 with weakness, hypoxemia, dizziness for 1 week.  Patient was found to have acute anemia.  Baseline hemoglobin is 13, hemoglobin at the facility was 9.0 with heme positive stool.  On the day of transfer to [facility] hemoglobin dropped to 8.1 and was given 1 unit packed RBC transfusion.  Patient was transferred for GI evaluation to [facility].  Patient is on Xarelto which was stopped on admission at [facility], was started on Protonix 40 mg IV every 24 hours. Patient presented with unexplained hypoxemia, 2D echo was obtained which showed mild systolic CHF with EF 40 to 45%, moderate to severe MR.  In 2017, patient had a EF of 65% on Lexiscan stress test.  Patient had normal chest x-ray and normal BNP.    Assessment & Plan    Principal Problem: Symptomatic anemia with GI bleed, acute on chronic blood loss anemia -Baseline hemoglobin~13, presented with hemoglobin of 9.6 which had respiratory down to 9.0 on the day of transfer.  Patient had reported black tarry stool, FOBT positive.  Xarelto was placed on hold -Patient was transferred to [facility], GI consulted -Continue IV PPI, n.p.o. for possible EGD -Hemoglobin 8.8 today Addendum: 6:45pm  - Called by RN, pt had just started bowel prep, now having rectal bleeding  - Vital signs stable, place on gentle IVF for now -ordered stat H/H,  called Dr Silverio Decamp, recommended to call oncall GI, Dr Oretha Caprice, called [facility] office, awaiting response  - If Hb trending down, will transfer to SDU. Alen Blew has been on hold.  Addendum: 7:00pm - Spoke with Dr Ardis Hughs, recommended 2 units pRBC's transfusion, supportive care, call back if H/H low or hemodynamically unstable to evaluate tonight - Inc IV PPI to 40mg  q12hours   - transferred to SDU, 2units pRBCs, signed out to Dr. Humphrey Rolls (overnight coverage), will call Dr Ardis Hughs if Hb <8 or unstable/brisk bleeding.  - discussed all the above with RN.      Active Problems: Systolic CHF (congestive heart failure) ([place]) -2D echo done during the admission at [facility] showed EF of 45 to 50%, apically akinesis, moderate to severe mitral regurgitation -Currently compensated, no hypoxia, chest x-ray at [facility] showed no pulmonary edema -Recommend outpatient follow-up with her cardiologist, currently no anginal symptoms, no acute shortness of breath -Currently on Lasix 20 mg daily    AF (paroxysmal atrial fibrillation) ([place]) -Patient had reported issues with palpitations for last several years,  currently rate controlled on beta-blocker -Xarelto on hold due to #1    Hypothyroidism -Continue Synthroid, TSH normal    Hyperlipidemia -Continue Pravachol  Dementia (HCC) -Currently stable  History of DVT, on chronic anticoagulation -Xarelto currently on hold  Code Status: Full CODE STATUS DVT Prophylaxis: SCDs Family Communication: Discussed all imaging results, lab results, explained to the patient and patient's son on the phone   Disposition Plan: Pending GI evaluation, possible endoscopy, PT evaluation precludes safe discharge home at this time.   Time Spent in minutes 25 minutes  Procedures:  2D echo at [facility]  Consultants:   Gastroenterology  Antimicrobials:   Anti-infectives (From admission, onward)   None         Medications  Scheduled Meds: . furosemide  20 mg Oral  Daily  . levothyroxine  25 mcg Oral Q0600  . memantine  10 mg Oral Daily  . pantoprazole (PROTONIX) IV  40 mg Intravenous Q24H  . pravastatin  20 mg Oral Daily   Continuous Infusions: PRN Meds:.acetaminophen **OR** acetaminophen, ondansetron **OR** ondansetron (ZOFRAN) IV, polyethylene glycol      Subjective:   Connie Baker was seen and examined today.  Denies any dizziness, lightheadedness, any nausea vomiting overnight.  No bleeding overnight. Patient denies  abdominal pain, D/C, new weakness, numbess, tingling. No acute events overnight.    Objective:   Vitals:   02/24/20 1632 02/24/20 2000 02/25/20 0532  BP: (!) 144/83 118/66 (!) 105/53  Pulse: (!) 102 93 92  Resp: 20 19 16   Temp: 98.5 F (36.9 C) 98.3 F (36.8 C) 98.6 F (37 C)  TempSrc: Oral Oral Oral  SpO2: 98% 97% 98%   No intake or output data in the 24 hours ending 02/25/20 1103   Wt Readings from Last 3 Encounters:  12/22/17 95.4 kg  08/13/17 93.4 kg  06/26/11 78.9 kg     Exam  General: Alert and oriented to self and place, NAD  Cardiovascular: S1 S2 auscultated,  RRR  Respiratory: Clear to auscultation bilaterally, no wheezing  Gastrointestinal: Soft, nontender, nondistended, + bowel sounds  Ext: no pedal edema bilaterally  Neuro: No new deficits  Musculoskeletal: No digital cyanosis, clubbing  Skin: No rashes  Psych: Pleasant, oriented to self and place   Data Reviewed:  I have personally reviewed following labs and imaging studies  Micro Results No results found for this or any previous visit (from the past 240 hour(s)).  Radiology Reports No results found.  Lab Data:  CBC: Recent Labs  Lab 02/24/20 1710 02/25/20 0556  WBC 6.2 6.5  HGB 9.4* 8.8*  HCT 30.3* 28.6*  MCV 103.8* 104.8*  PLT 250 [id number]   Basic Metabolic Panel: Recent Labs  Lab 02/25/20 0556  NA 144  K 4.2  CL 109  CO2 26  GLUCOSE 98  BUN 24*  CREATININE 0.85  CALCIUM 8.1*   GFR: CrCl cannot be  calculated (Unknown ideal weight.). Liver Function Tests: No results for input(s): AST, ALT, ALKPHOS, BILITOT, PROT, ALBUMIN in the last 168 hours. No results for input(s): LIPASE, AMYLASE in the last 168 hours. No results for input(s): AMMONIA in the last 168 hours. Coagulation Profile: No results for input(s): INR, PROTIME in the last 168 hours. Cardiac Enzymes: No results for input(s): CKTOTAL, CKMB, CKMBINDEX, TROPONINI in the last 168 hours. BNP (last 3 results) No results for input(s): PROBNP in the last 8760 hours. HbA1C: No results for input(s): HGBA1C in the last 72 hours. CBG: No results for input(s):  GLUCAP in the last 168 hours. Lipid Profile: No results for input(s): CHOL, HDL, LDLCALC, TRIG, CHOLHDL, LDLDIRECT in the last 72 hours. Thyroid Function Tests: No results for input(s): TSH, T4TOTAL, FREET4, T3FREE, THYROIDAB in the last 72 hours. Anemia Panel: No results for input(s): VITAMINB12, FOLATE, FERRITIN, TIBC, IRON, RETICCTPCT in the last 72 hours. Urine analysis: No results found for: COLORURINE, APPEARANCEUR, LABSPEC, PHURINE, GLUCOSEU, HGBUR, BILIRUBINUR, KETONESUR, PROTEINUR, UROBILINOGEN, NITRITE, LEUKOCYTESUR   Karol Skarzynski M.D. [facility] 02/25/2020, 11:03 AM   Call night coverage person covering after 7pm

## 2020-02-25 NOTE — Progress Notes (Addendum)
Report called and given to Ander Purpura, RN at [phone]; informed patient will be transferred when IV team is done placing new IV sites for patient. Candis Musa, RN called the son to update him and obtain verbal consent for blood transfusion. Received call from Blood bank about the 1unit PRBC limit for O+ blood type nonemergent cases. Lauren updated and informed on what to do if the 2nd unit is needed. Patient had a bowel incontinence episode and was cleaned. Called telemetry to notify patient will be transferred to room 1222. Patient transferred to 1222 at 2055 in a low bed with SCD pump and her belongings.   2120  Sent notification to Dr. Humphrey Rolls regarding H&H results (per order), patient location, ICU RN info., 1unit PRBC limit.

## 2020-02-25 NOTE — Consult Note (Addendum)
Referring Provider:  ? Dr. Tana Coast, [facility] Primary Care Physician:  Helen Hashimoto., MD Primary Gastroenterologist:  Althia Forts  Reason for Consultation:  Symptomatic anemia and heme positive stool  HPI: Connie Baker is a 86 y.o. female with medical history significant for paroxysmal atrial fibrillation, history of DVT on xarelto, hyperlipidemia, hypothyroidism who was admitted to [facility] in November with complaints of weakness, hypoxemia, dizziness for 1 week found to have acute anemia.  Her baseline hemoglobin is 13 grams, at the end of admission to that facility she was found to have a hemoglobin of 9.0 grams.  She had heme positive stool at the facility as well after admission.  Hemoglobin fell further today to 8.1 grams by report, she was given 1 unit of blood and plan of care was discussed with her son, who agreed to transfer to this facility today for GI evaluation, as gastroenterology specialist is not available at [facility] this week.  Says that she has just been feeling very weak and tired.  Having trouble remembering things during our visit.  I discussed with her son, Connie Baker, on the phone and he says that is very usual for her, memory has been worsening over the past several months.  If she has ever had a colonoscopy or EGD in the past then it was well over 5 years ago.  He does recall, however, that she maybe had issues with bleeding ulcers in the past.  BUN elevated at 24.  Hgb has been fairly stable here, 8.8 grams this AM.  On Xarelto, last dose unknown but no later than 3/13.   Past Medical History:  Diagnosis Date  . Atrial fibrillation ([place])   . CAD (coronary artery disease)   . Cancer [facility])    uterine cancer  . H/O: GI bleed   . History of shingles   . Hyperlipidemia   . Hypertension   . Osteoarthritis   . Stroke ([place])   . Thromboembolus ([place])    Left femoral -tibial artery    Past Surgical History:  Procedure Laterality Date  . ABDOMINAL  HYSTERECTOMY    . APPENDECTOMY    . THROMBOEMBOLECTOMY  05/30/11   Left femoral-popliteal artery   . TONSILLECTOMY      Prior to Admission medications   Medication Sig Start Date End Date Taking? Authorizing Provider  acetaminophen (TYLENOL) 500 MG tablet Take 1,000 mg by mouth every 6 (six) hours as needed for moderate pain.   Yes [provider]  cholecalciferol (VITAMIN D3) 25 MCG (1000 UNIT) tablet Take 1,000 Units by mouth daily.   Yes [provider]  loratadine (CLARITIN) 10 MG tablet Take 10 mg by mouth daily.   Yes [provider]  Multiple Vitamin (MULTIVITAMIN WITH MINERALS) TABS tablet Take 1 tablet by mouth daily.   Yes [provider]  furosemide (LASIX) 20 MG tablet Take 20 mg by mouth daily.     [provider]  gabapentin (NEURONTIN) 300 MG capsule TAKE 2 CAPSULES BY MOUTH IN THE MORNING, THEN TAKE 1 CAPSULE IN THE AFTERNOON, THEN TAKE 1 CAPSULE AT BEDTIME Patient taking differently: Take 300-600 mg by mouth See admin instructions. 600mg  in the am and 300 mg twice a day 08/16/18   Narda Amber K, DO  levothyroxine (SYNTHROID, LEVOTHROID) 25 MCG tablet Take 25 mcg by mouth daily before breakfast.  08/07/17   [provider]  memantine (NAMENDA) 10 MG tablet Take 10 mg by mouth 2 (two) times daily.  07/16/17  [provider]  metoprolol tartrate (LOPRESSOR) 50 MG tablet Take 75 mg by mouth 2 (two) times daily.  07/13/17   [provider]  pravastatin (PRAVACHOL) 20 MG tablet Take 20 mg by mouth at bedtime.  06/01/17   [provider]  XARELTO 20 MG TABS tablet Take 20 mg by mouth in the morning.  08/11/17   [provider]    Current Facility-Administered Medications  Medication Dose Route Frequency Provider Last Rate Last Admin  . acetaminophen (TYLENOL) tablet 650 mg  650 mg Oral Q6H PRN Tomma Rakers, MD   650 mg at 02/24/20 2326   Or  . acetaminophen (TYLENOL) suppository 650  mg  650 mg Rectal Q6H PRN Hollice Gong, Mir Mohammed, MD      . furosemide (LASIX) tablet 20 mg  20 mg Oral Daily Hollice Gong, Mir Mohammed, MD      . levothyroxine (SYNTHROID) tablet 25 mcg  25 mcg Oral Q0600 Tomma Rakers, MD   25 mcg at 02/25/20 0617  . memantine (NAMENDA) tablet 10 mg  10 mg Oral Daily Hollice Gong, Mir Mohammed, MD   10 mg at 02/24/20 1937  . ondansetron (ZOFRAN) tablet 4 mg  4 mg Oral Q6H PRN Hollice Gong, Mir Mohammed, MD       Or  . ondansetron [facility]) injection 4 mg  4 mg Intravenous Q6H PRN Hollice Gong, Mir Mohammed, MD      . pantoprazole (PROTONIX) injection 40 mg  40 mg Intravenous Q24H Hollice Gong, Mir Mohammed, MD   40 mg at 02/25/20 0030  . polyethylene glycol (MIRALAX / GLYCOLAX) packet 17 g  17 g Oral Daily PRN Hollice Gong, Mir Mohammed, MD      . pravastatin (PRAVACHOL) tablet 20 mg  20 mg Oral Daily Hollice Gong, Mir Mohammed, MD   20 mg at 02/24/20 1937    Allergies as of 02/23/2020 - Review Complete 12/22/2017  Allergen Reaction Noted  . Sulfa antibiotics  06/26/2011    Family History  Problem Relation Age of Onset  . Breast cancer Mother   . Stroke Father     Social History   Socioeconomic History  . Marital status: Married    Spouse name: Not on file  . Number of children: Not on file  . Years of education: Not on file  . Highest education level: Not on file  Occupational History  . Not on file  Tobacco Use  . Smoking status: Former Smoker    Types: Cigarettes    Quit date: 06/26/1975    Years since quitting: 44.6  . Smokeless tobacco: Never Used  Substance and Sexual Activity  . Alcohol use: No  . Drug use: No  . Sexual activity: Not on file  Other Topics Concern  . Not on file  Social History Narrative   Lives alone in a one story home.  Has 3 children.  Retired from [employer].  Education: high school.     Social Determinants of Health   Financial Resource Strain:   . Difficulty of Paying Living Expenses:   Food  Insecurity:   . Worried About Charity fundraiser in the Last Year:   . Arboriculturist in the Last Year:   Transportation Needs:   . Film/video editor (Medical):   [name] Lack of Transportation (Non-Medical):   Physical Activity:   . Days of Exercise per Week:   . Minutes of Exercise per Session:   Stress:   . Feeling of Stress :  Social Connections:   . Frequency of Communication with Friends and Family:   . Frequency of Social Gatherings with Friends and Family:   . Attends Religious Services:   . Active Member of Clubs or Organizations:   . Attends Archivist Meetings:   [name] Marital Status:   Intimate Partner Violence:   . Fear of Current or Ex-Partner:   . Emotionally Abused:   [name] Physically Abused:   . Sexually Abused:     Review of Systems: ROS is O/W negative except as mentioned in HPI.  Physical Exam: Vital signs in last 24 hours: Temp:  [98.3 F (36.8 C)-98.6 F (37 C)] 98.6 F (37 C) (03/14 0532) Pulse Rate:  [92-102] 92 (03/14 0532) Resp:  [16-20] 16 (03/14 0532) BP: (105-144)/(53-83) 105/53 (03/14 0532) SpO2:  [97 %-98 %] 98 % (03/14 0532) Last BM Date: 02/24/20 General:  Alert, Well-developed, well-nourished, pleasant and cooperative in NAD Head:  Normocephalic and atraumatic. Eyes:  Sclera clear, no icterus.  Conjunctiva pink. Ears:  Normal auditory acuity. Mouth:  No deformity or lesions.   Lungs:  Clear throughout to auscultation.  No wheezes, crackles, or rhonchi.  Heart:  Regular rate and rhythm; no murmurs, clicks, rubs, or gallops. Abdomen:  Soft, non-distended.  BS present.  Non-tender.  Rectal:  Deferred.  Heme positive.  Msk:  Symmetrical without gross deformities. Pulses:  Normal pulses noted. Extremities:  Without clubbing or edema. Neurologic:  Alert.  Confused when trying to gather her thoughts about things. Skin:  Intact without significant lesions or rashes.  Lab Results: Recent Labs    02/24/20 1710 02/25/20 0556  WBC  6.2 6.5  HGB 9.4* 8.8*  HCT 30.3* 28.6*  PLT 250 228   BMET Recent Labs    02/25/20 0556  NA 144  K 4.2  CL 109  CO2 26  GLUCOSE 98  BUN 24*  CREATININE 0.85  CALCIUM 8.1*   IMPRESSION:  *Acute on chronic anemia and hemoccult positive stool:  Hgb baseline apparently 13 grams, has been low around 9 since November.  8.8 grams this AM.   *History of DVT and atrial fibrillation: On Xarelto.  Last dose no later than 3/13.  Currently on hold. *? History of bleeding ulcers in the remote past  PLAN: -I spoke with her son, Connie Baker, on the phone.  Both he and the patient are agreeable to endoscopy and colonoscopy.  We will plan for these on March 15.  She can have clear liquids today then n.p.o. after midnight. -Monitor hemoglobin transfuse further if needed. -Currently on Protonix 40 mg IV daily and will just continue that for now.   Laban Emperor. Zehr  02/25/2020, 11:39 AM    Attending physician's note   I have taken a history, examined the patient and reviewed the chart. I agree with the Advanced Practitioner's note, impression and recommendations.  Acute on chronic anemia, fecal heme occult positive. On chronic anticoagulation with Xarelto  ?Melena. Patient is not good historian, says "she is more forgetful in past few days, is new for her"  Monitor Hgb and transfuse as needed if <7 [place] for EGD and Colonoscopy tomorrow Will need to exclude PUD, malignancy or high risk lesion so can determine risk for recurrent bleed esp given she is on long term anticoagulation  The risks and benefits as well as alternatives of endoscopic procedure(s) have been discussed and reviewed. All questions answered. The patient agrees to proceed.    Damaris Hippo ,  MD [phone]

## 2020-02-26 ENCOUNTER — Encounter (HOSPITAL_COMMUNITY): Payer: Self-pay | Admitting: Internal Medicine

## 2020-02-26 ENCOUNTER — Other Ambulatory Visit: Payer: Self-pay

## 2020-02-26 DIAGNOSIS — Z7901 Long term (current) use of anticoagulants: Secondary | ICD-10-CM

## 2020-02-26 DIAGNOSIS — K922 Gastrointestinal hemorrhage, unspecified: Secondary | ICD-10-CM

## 2020-02-26 LAB — BASIC METABOLIC PANEL
Anion gap: 10 (ref 5–15)
BUN: 20 mg/dL (ref 8–23)
CO2: 27 mmol/L (ref 22–32)
Calcium: 8.2 mg/dL — ABNORMAL LOW (ref 8.9–10.3)
Chloride: 109 mmol/L (ref 98–111)
Creatinine, Ser: 0.88 mg/dL (ref 0.44–1.00)
GFR calc Af Amer: >60 mL/min (ref >60)
GFR calc non Af Amer: 58 mL/min — ABNORMAL LOW (ref >60)
Glucose, Bld: 114 mg/dL — ABNORMAL HIGH (ref 70–99)
Potassium: 4.1 mmol/L (ref 3.5–5.1)
Sodium: 146 mmol/L — ABNORMAL HIGH (ref 135–145)

## 2020-02-26 LAB — CBC
HCT: 29.2 % — ABNORMAL LOW (ref 36.0–46.0)
Hemoglobin: 9.4 g/dL — ABNORMAL LOW (ref 12.0–15.0)
MCH: 33 pg (ref 26.0–34.0)
MCHC: 32.2 g/dL (ref 30.0–36.0)
MCV: 102.5 fL — ABNORMAL HIGH (ref 80.0–100.0)
Platelets: 251 10*3/uL (ref 150–400)
RBC: 2.85 MIL/uL — ABNORMAL LOW (ref 3.87–5.11)
RDW: 14.1 % (ref 11.5–15.5)
WBC: 6.1 10*3/uL (ref 4.0–10.5)
nRBC: 0 % (ref 0.0–0.2)

## 2020-02-26 LAB — HEMOGLOBIN AND HEMATOCRIT, BLOOD
HCT: 29.9 % — ABNORMAL LOW (ref 36.0–46.0)
HCT: 29.9 % — ABNORMAL LOW (ref 36.0–46.0)
Hemoglobin: 9.4 g/dL — ABNORMAL LOW (ref 12.0–15.0)
Hemoglobin: 9.5 g/dL — ABNORMAL LOW (ref 12.0–15.0)

## 2020-02-26 MED ORDER — METOPROLOL TARTRATE 25 MG PO TABS
25.0000 mg | ORAL_TABLET | Freq: Two times a day (BID) | ORAL | Status: DC
  Administered 2020-02-26 – 2020-03-04 (×15): 25 mg via ORAL
  Filled 2020-02-26 (×16): qty 1

## 2020-02-26 MED ORDER — PEG-KCL-NACL-NASULF-NA ASC-C 100 G PO SOLR
0.5000 | Freq: Once | ORAL | Status: AC
  Administered 2020-02-26: 100 g via ORAL
  Filled 2020-02-26: qty 1

## 2020-02-26 MED ORDER — PEG-KCL-NACL-NASULF-NA ASC-C 100 G PO SOLR: 1.0000 | Freq: Once | ORAL | Status: DC

## 2020-02-26 MED ORDER — METOPROLOL TARTRATE 25 MG PO TABS: 25.0000 mg | ORAL_TABLET | Freq: Two times a day (BID) | ORAL | Status: DC

## 2020-02-26 MED ORDER — SODIUM CHLORIDE 0.9 % IV SOLN: INTRAVENOUS | Status: DC

## 2020-02-26 MED ORDER — PEG-KCL-NACL-NASULF-NA ASC-C 100 G PO SOLR
0.5000 | Freq: Once | ORAL | Status: AC
  Administered 2020-02-26: 100 g via ORAL

## 2020-02-26 NOTE — Progress Notes (Signed)
Incontinent of urine and stool therefore unable to differentiate between the two when patient voids or has BM. Patient almost finished with bowel prep.

## 2020-02-26 NOTE — H&P (View-Only) (Signed)
Patient ID: Connie Baker, female   DOB: 11/01/1929, 86 y.o.   MRN: [id number]    Progress Note   Subjective   day # 2  CC; GI bleed, weakness and dizziness x one week  HGb 9.4 this am - post 2 units  BUN 20/creat .88  Pt   complains of feeling weak, says she cannot think straight.  Unaware of any bleeding this morning, denies any abdominal pain or nausea  Nurse reports no active bleeding overnight, reddish-brown bowel movement at 6 AM  I spoke with patient's daughter Tammy by phone, family very much wants her to have endoscopic evaluation to determine etiology of bleeding prior to making any decisions regarding palliative care. Daughter says she was with her mom last night to do drink 1 L of prep  Patient had 2 grossly bloody bowel movements early in the evening, was transferred to stepdown but did not have any hemodynamic instability     Objective   Vital signs in last 24 hours: Temp:  [97.9 F (36.6 C)-98.4 F (36.9 C)] 98.4 F (36.9 C) (03/15 0644) Pulse Rate:  [41-130] 116 (03/15 0600) Resp:  [10-26] 18 (03/15 0600) BP: (128-143)/(56-93) 138/82 (03/15 0600) SpO2:  [95 %-100 %] 95 % (03/15 0600) Last BM Date: 02/25/20 General: Very elderly white female in NAD, pleasant Heart: Tachycardia irregular regular rate and rhythm; no murmurs Lungs: Respirations even and unlabored, lungs CTA bilaterally Abdomen:  Soft, nontender and nondistended. Normal bowel sounds. Extremities:  Without edema. Neurologic:  Alert and oriented,  grossly normal neurologically. Psych:  Cooperative. Normal mood and affect.  Intake/Output from previous day: 03/14 0701 - 03/15 0700 In: 588 [I.V.:588] Out: 600 [Urine:600] Intake/Output this shift: No intake/output data recorded.  Lab Results: Recent Labs    02/24/20 1710 02/24/20 1710 02/25/20 0556 02/25/20 0556 02/25/20 1915 02/26/20 0009 02/26/20 0539  WBC 6.2  --  6.5  --   --  6.1  --   HGB 9.4*   < > 8.8*   < > 10.1* 9.4* 9.4*   HCT 30.3*   < > 28.6*   < > 31.5* 29.2* 29.9*  PLT 250  --  228  --   --  251  --    < > = values in this interval not displayed.   BMET Recent Labs    02/25/20 0556 02/26/20 0009  NA 144 146*  K 4.2 4.1  CL 109 109  CO2 26 27  GLUCOSE 98 114*  BUN 24* 20  CREATININE 0.85 0.88  CALCIUM 8.1* 8.2*   LFT No results for input(s): PROT, ALBUMIN, AST, ALT, ALKPHOS, BILITOT, BILIDIR, IBILI in the last 72 hours. PT/INR No results for input(s): LABPROT, INR in the last 72 hours.  Studies/Results: No results found.     Assessment / Plan:    #68 86 year old white female admitted to [facility] on 02/22/2019 with 1 week history of weakness and dizziness and found to be acutely anemic with heme positive stool, in setting of chronic Xarelto. She had 2 units of packed RBCs while at [facility] and has had 2 further units here since admission. Xarelto on hold. Patient had more active bleeding last evening after starting bowel prep with more grossly bloody stool x2.  She has been hemodynamically stable, she was only able to drink about half the prep.  Etiology of bleeding not clear, question diverticular cannot rule out occult neoplasm.  #2 history of atrial fibrillation #3 dementia  Plan; clear liquid diet  today, n.p.o. after midnight Continue twice daily Protonix Every 6 hour hemoglobins and transfuse to keep hemoglobin above 8 given advanced age Will reattempt bowel prep today, and reschedule for EGD and colon in a.m. with Dr. Hilarie Fredrickson   Principal Problem:   Anemia Active Problems:   GI bleed   Acute systolic CHF (congestive heart failure) (HCC)   AF (paroxysmal atrial fibrillation) (HCC)   Hypothyroidism   Hyperlipidemia   Acute respiratory failure with hypoxia (HCC)   Dementia ([place])     LOS: 2 days   Shariya Gaster  02/26/2020, 8:54 AM

## 2020-02-26 NOTE — Progress Notes (Signed)
[facility]                                                                              Patient Demographics  Connie Baker, is a 86 y.o. female, DOB - Aug 14, 1929, [id number]  Admit date - 02/24/2020   Admitting Physician Mir Marry Guan, MD  Outpatient Primary MD for the patient is Helen Hashimoto., MD  Outpatient specialists:   LOS - 2  days   Medical records reviewed and are as summarized below:    No chief complaint on file.      Brief summary   Patient is a 86 year old female with history of paroxysmal A. fib, hyperlipidemia, hypothyroidism who was admitted to [facility] on 11/13 with weakness, hypoxemia, dizziness for 1 week.  Patient was found to have acute anemia.  Baseline hemoglobin is 13, hemoglobin at the facility was 9.0 with heme positive stool.  On the day of transfer to [facility] hemoglobin dropped to 8.1 and was given 1 unit packed RBC transfusion.  Patient was transferred for GI evaluation to [facility].  Patient is on Xarelto which was stopped on admission at [facility], was started on Protonix 40 mg IV every 24 hours. Patient presented with unexplained hypoxemia, 2D echo was obtained which showed mild systolic CHF with EF 40 to 45%, moderate to severe MR.  In 2017, patient had a EF of 65% on Lexiscan stress test.  Patient had normal chest x-ray and normal BNP.    Assessment & Plan    Principal Problem: Symptomatic anemia with GI bleed, acute on chronic blood loss anemia -Baseline hemoglobin~13, presented with hemoglobin of 9.6 which had respiratory down to 9.0 on the day of transfer.  Patient had reported black tarry stool, FOBT positive.  Xarelto was placed on hold -Patient was transferred to [facility], GI consulted -Patient was placed on IV PPI. -On 3/14 p.m. patient started having active rectal bleeding, painless, was transferred to stepdown unit.  H&H remained stable -GI following, plan for EGD and colonoscopy on 3/16, n.p.o. after  midnight -Xarelto currently on hold    Active Problems: Systolic CHF (congestive heart failure) ([place]) -2D echo done during the admission at [facility] showed EF of 45 to 50%, apically akinesis, moderate to severe mitral regurgitation - chest x-ray at [facility] showed no pulmonary edema -Recommend outpatient follow-up with her cardiologist, currently no anginal symptoms, no acute shortness of breath -Currently compensated, no hypoxia, only on clear liquid diet, will hold Lasix for now (was placed on 20 mg p.o. daily)  Paroxysmal AF (paroxysmal atrial fibrillation) ([place]) with RVR -Patient had reported issues with palpitations for last several years -Xarelto on hold due to #1 -Beta-blocker was held at [facility] due to borderline BP.  Overnight noted to be in RVR, metoprolol restarted 25 mg p.o. twice daily (home dose 50 mg PO BID), titrate as tolerated with BP.      Hypothyroidism -Continue Synthroid, TSH normal    Hyperlipidemia -Continue Pravachol  Dementia (HCC) -Currently stable  History of DVT, on chronic anticoagulation -Xarelto currently on hold  Code Status: Full CODE STATUS DVT Prophylaxis: SCDs Family Communication: Discussed all imaging results,  lab results, explained to the patient and patient's son on the phone   Disposition Plan: EGD, colonoscopy on 3/16 planned, precludes safe discharge home today.   Time Spent in minutes 25 minutes  Procedures:  2D echo at [facility]  Consultants:   Gastroenterology  Antimicrobials:   Anti-infectives (From admission, onward)   None         Medications  Scheduled Meds: . Chlorhexidine Gluconate Cloth  6 each Topical Daily  . levothyroxine  25 mcg Oral Q0600  . memantine  10 mg Oral Daily  . metoprolol tartrate  25 mg Oral BID  . pantoprazole (PROTONIX) IV  40 mg Intravenous Q12H  . peg 3350 powder  0.5 kit Oral Once   And  . peg 3350 powder  0.5 kit Oral Once  . pravastatin  20 mg Oral Daily   Continuous Infusions: .  sodium chloride 20 mL/hr at 02/26/20 0943  . sodium chloride 100 mL/hr at 02/26/20 0356   PRN Meds:.acetaminophen **OR** acetaminophen, ondansetron **OR** ondansetron (ZOFRAN) IV      Subjective:   Connie Baker was seen and examined today.  Patient had active rectal bleeding yesterday evening.  No bleeding this morning.  No dizziness or lightheadedness at this time.  Overnight was in rapid A. fib with RVR. Patient denies  abdominal pain, D/C, new weakness, numbess, tingling.   Objective:   Vitals:   02/26/20 0600 02/26/20 0644 02/26/20 0800 02/26/20 1200  BP: 138/82  108/61   Pulse: (!) 116  (!) 105   Resp: 18  15   Temp:  98.4 F (36.9 C)  98.2 F (36.8 C)  TempSrc:  Oral  Oral  SpO2: 95%  94%     Intake/Output Summary (Last 24 hours) at 02/26/2020 1318 Last data filed at 02/26/2020 0356 Gross per 24 hour  Intake 587.95 ml  Output 600 ml  Net -12.05 ml     Wt Readings from Last 3 Encounters:  12/22/17 95.4 kg  08/13/17 93.4 kg  06/26/11 78.9 kg    Physical Exam  General: Alert and oriented x self and place, NAD  Cardiovascular: S1 S2 clear, RRR. No pedal edema b/l  Respiratory: CTAB, no wheezing, rales or rhonchi  Gastrointestinal: Soft, nontender, nondistended, NBS  Ext: no pedal edema bilaterally  Neuro: no new deficits  Musculoskeletal: No cyanosis, clubbing  Skin: No rashes  Psych: Dementia     Data Reviewed:  I have personally reviewed following labs and imaging studies  Micro Results Recent Results (from the past 240 hour(s))  MRSA PCR Screening     Status: None   Collection Time: 02/25/20  9:08 PM   Specimen: Nasal Mucosa; Nasopharyngeal  Result Value Ref Range Status   MRSA by PCR NEGATIVE NEGATIVE Final    Comment:        The GeneXpert MRSA Assay (FDA approved for NASAL specimens only), is one component of a comprehensive MRSA colonization surveillance program. It is not intended to diagnose MRSA infection nor to guide  or monitor treatment for MRSA infections. Performed at [facility], [place] [address], [place], [place] [address]     Radiology Reports No results found.  Lab Data:  CBC: Recent Labs  Lab 02/24/20 1710 02/25/20 0556 02/25/20 1915 02/26/20 0009 02/26/20 0539  WBC 6.2 6.5  --  6.1  --   HGB 9.4* 8.8* 10.1* 9.4* 9.4*  HCT 30.3* 28.6* 31.5* 29.2* 29.9*  MCV 103.8* 104.8*  --  102.5*  --  PLT 250 228  --  251  --    Basic Metabolic Panel: Recent Labs  Lab 02/25/20 0556 02/26/20 0009  NA 144 146*  K 4.2 4.1  CL 109 109  CO2 26 27  GLUCOSE 98 114*  BUN 24* 20  CREATININE 0.85 0.88  CALCIUM 8.1* 8.2*   GFR: CrCl cannot be calculated (Unknown ideal weight.). Liver Function Tests: No results for input(s): AST, ALT, ALKPHOS, BILITOT, PROT, ALBUMIN in the last 168 hours. No results for input(s): LIPASE, AMYLASE in the last 168 hours. No results for input(s): AMMONIA in the last 168 hours. Coagulation Profile: No results for input(s): INR, PROTIME in the last 168 hours. Cardiac Enzymes: No results for input(s): CKTOTAL, CKMB, CKMBINDEX, TROPONINI in the last 168 hours. BNP (last 3 results) No results for input(s): PROBNP in the last 8760 hours. HbA1C: No results for input(s): HGBA1C in the last 72 hours. CBG: No results for input(s): GLUCAP in the last 168 hours. Lipid Profile: No results for input(s): CHOL, HDL, LDLCALC, TRIG, CHOLHDL, LDLDIRECT in the last 72 hours. Thyroid Function Tests: No results for input(s): TSH, T4TOTAL, FREET4, T3FREE, THYROIDAB in the last 72 hours. Anemia Panel: No results for input(s): VITAMINB12, FOLATE, FERRITIN, TIBC, IRON, RETICCTPCT in the last 72 hours. Urine analysis: No results found for: COLORURINE, APPEARANCEUR, LABSPEC, PHURINE, GLUCOSEU, HGBUR, BILIRUBINUR, KETONESUR, PROTEINUR, UROBILINOGEN, NITRITE, LEUKOCYTESUR   Ninette Cotta M.D. [facility] 02/26/2020, 1:18 PM   Call night coverage person  covering after 7pm

## 2020-02-26 NOTE — Progress Notes (Signed)
Patient ID: Connie Baker, female   DOB: 12-25-1928, 86 y.o.   MRN: [id number]    Progress Note   Subjective   day # 2  CC; GI bleed, weakness and dizziness x one week  HGb 9.4 this am - post 2 units  BUN 20/creat .88  Pt   complains of feeling weak, says she cannot think straight.  Unaware of any bleeding this morning, denies any abdominal pain or nausea  Nurse reports no active bleeding overnight, reddish-brown bowel movement at 6 AM  I spoke with patient's daughter Connie Baker by phone, family very much wants her to have endoscopic evaluation to determine etiology of bleeding prior to making any decisions regarding palliative care. Daughter says she was with her mom last night to do drink 1 L of prep  Patient had 2 grossly bloody bowel movements early in the evening, was transferred to stepdown but did not have any hemodynamic instability     Objective   Vital signs in last 24 hours: Temp:  [97.9 F (36.6 C)-98.4 F (36.9 C)] 98.4 F (36.9 C) (03/15 0644) Pulse Rate:  [41-130] 116 (03/15 0600) Resp:  [10-26] 18 (03/15 0600) BP: (128-143)/(56-93) 138/82 (03/15 0600) SpO2:  [95 %-100 %] 95 % (03/15 0600) Last BM Date: 02/25/20 General: Very elderly white female in NAD, pleasant Heart: Tachycardia irregular regular rate and rhythm; no murmurs Lungs: Respirations even and unlabored, lungs CTA bilaterally Abdomen:  Soft, nontender and nondistended. Normal bowel sounds. Extremities:  Without edema. Neurologic:  Alert and oriented,  grossly normal neurologically. Psych:  Cooperative. Normal mood and affect.  Intake/Output from previous day: 03/14 0701 - 03/15 0700 In: 588 [I.V.:588] Out: 600 [Urine:600] Intake/Output this shift: No intake/output data recorded.  Lab Results: Recent Labs    02/24/20 1710 02/24/20 1710 02/25/20 0556 02/25/20 0556 02/25/20 1915 02/26/20 0009 02/26/20 0539  WBC 6.2  --  6.5  --   --  6.1  --   HGB 9.4*   < > 8.8*   < > 10.1* 9.4* 9.4*    HCT 30.3*   < > 28.6*   < > 31.5* 29.2* 29.9*  PLT 250  --  228  --   --  251  --    < > = values in this interval not displayed.   BMET Recent Labs    02/25/20 0556 02/26/20 0009  NA 144 146*  K 4.2 4.1  CL 109 109  CO2 26 27  GLUCOSE 98 114*  BUN 24* 20  CREATININE 0.85 0.88  CALCIUM 8.1* 8.2*   LFT No results for input(s): PROT, ALBUMIN, AST, ALT, ALKPHOS, BILITOT, BILIDIR, IBILI in the last 72 hours. PT/INR No results for input(s): LABPROT, INR in the last 72 hours.  Studies/Results: No results found.     Assessment / Plan:    #44 86 year old white female admitted to [facility] on 02/22/2019 with 1 week history of weakness and dizziness and found to be acutely anemic with heme positive stool, in setting of chronic Xarelto. She had 2 units of packed RBCs while at [facility] and has had 2 further units here since admission. Xarelto on hold. Patient had more active bleeding last evening after starting bowel prep with more grossly bloody stool x2.  She has been hemodynamically stable, she was only able to drink about half the prep.  Etiology of bleeding not clear, question diverticular cannot rule out occult neoplasm.  #2 history of atrial fibrillation #3 dementia  Plan; clear liquid  diet today, n.p.o. after midnight Continue twice daily Protonix Every 6 hour hemoglobins and transfuse to keep hemoglobin above 8 given advanced age Will reattempt bowel prep today, and reschedule for EGD and colon in a.m. with Dr. Hilarie Fredrickson   Principal Problem:   Anemia Active Problems:   GI bleed   Acute systolic CHF (congestive heart failure) (HCC)   AF (paroxysmal atrial fibrillation) (HCC)   Hypothyroidism   Hyperlipidemia   Acute respiratory failure with hypoxia (HCC)   Dementia ([place])     LOS: 2 days   Kaylia Winborne  02/26/2020, 8:54 AM

## 2020-02-27 ENCOUNTER — Encounter (HOSPITAL_COMMUNITY)
Admission: AD | Disposition: A | Discharge status: Skilled Nursing Facility | Payer: Self-pay | Source: Other Acute Inpatient Hospital | Attending: Internal Medicine

## 2020-02-27 ENCOUNTER — Encounter (HOSPITAL_COMMUNITY): Payer: Self-pay | Admitting: Internal Medicine

## 2020-02-27 ENCOUNTER — Inpatient Hospital Stay (HOSPITAL_COMMUNITY): Payer: Medicare HMO | Admitting: Certified Registered Nurse Anesthetist

## 2020-02-27 DIAGNOSIS — D125 Benign neoplasm of sigmoid colon: Secondary | ICD-10-CM

## 2020-02-27 DIAGNOSIS — R195 Other fecal abnormalities: Secondary | ICD-10-CM

## 2020-02-27 DIAGNOSIS — K5521 Angiodysplasia of colon with hemorrhage: Secondary | ICD-10-CM

## 2020-02-27 DIAGNOSIS — D62 Acute posthemorrhagic anemia: Secondary | ICD-10-CM

## 2020-02-27 DIAGNOSIS — K635 Polyp of colon: Secondary | ICD-10-CM

## 2020-02-27 HISTORY — PX: HOT HEMOSTASIS: SHX5433

## 2020-02-27 HISTORY — PX: POLYPECTOMY: SHX5525

## 2020-02-27 HISTORY — PX: COLONOSCOPY WITH PROPOFOL: SHX5780

## 2020-02-27 HISTORY — PX: HEMOSTASIS CLIP PLACEMENT: SHX6857

## 2020-02-27 HISTORY — PX: ESOPHAGOGASTRODUODENOSCOPY (EGD) WITH PROPOFOL: SHX5813

## 2020-02-27 LAB — CBC
HCT: 28.6 % — ABNORMAL LOW (ref 36.0–46.0)
Hemoglobin: 8.9 g/dL — ABNORMAL LOW (ref 12.0–15.0)
MCH: 33.2 pg (ref 26.0–34.0)
MCHC: 31.1 g/dL (ref 30.0–36.0)
MCV: 106.7 fL — ABNORMAL HIGH (ref 80.0–100.0)
Platelets: 244 10*3/uL (ref 150–400)
RBC: 2.68 MIL/uL — ABNORMAL LOW (ref 3.87–5.11)
RDW: 14.9 % (ref 11.5–15.5)
WBC: 5.3 10*3/uL (ref 4.0–10.5)
nRBC: 0 % (ref 0.0–0.2)

## 2020-02-27 LAB — BASIC METABOLIC PANEL
Anion gap: 9 (ref 5–15)
BUN: 20 mg/dL (ref 8–23)
CO2: 24 mmol/L (ref 22–32)
Calcium: 8.6 mg/dL — ABNORMAL LOW (ref 8.9–10.3)
Chloride: 116 mmol/L — ABNORMAL HIGH (ref 98–111)
Creatinine, Ser: 1 mg/dL (ref 0.44–1.00)
GFR calc Af Amer: 58 mL/min — ABNORMAL LOW (ref >60)
GFR calc non Af Amer: 50 mL/min — ABNORMAL LOW (ref >60)
Glucose, Bld: 114 mg/dL — ABNORMAL HIGH (ref 70–99)
Potassium: 4.2 mmol/L (ref 3.5–5.1)
Sodium: 149 mmol/L — ABNORMAL HIGH (ref 135–145)

## 2020-02-27 LAB — HEMOGLOBIN AND HEMATOCRIT, BLOOD
HCT: 28.3 % — ABNORMAL LOW (ref 36.0–46.0)
Hemoglobin: 8.9 g/dL — ABNORMAL LOW (ref 12.0–15.0)

## 2020-02-27 SURGERY — ESOPHAGOGASTRODUODENOSCOPY (EGD) WITH PROPOFOL
Anesthesia: Monitor Anesthesia Care

## 2020-02-27 MED ORDER — PROPOFOL 500 MG/50ML IV EMUL
INTRAVENOUS | Status: DC | PRN
  Administered 2020-02-27: 100 ug/kg/min via INTRAVENOUS

## 2020-02-27 MED ORDER — LACTATED RINGERS IV SOLN
INTRAVENOUS | Status: DC
  Administered 2020-02-27: 11:00:00 1000 mL via INTRAVENOUS

## 2020-02-27 MED ORDER — DEXTROSE IN LACTATED RINGERS 5 % IV SOLN: INTRAVENOUS | Status: AC

## 2020-02-27 MED ORDER — PROPOFOL 10 MG/ML IV BOLUS
INTRAVENOUS | Status: DC | PRN
  Administered 2020-02-27 (×2): 20 mg via INTRAVENOUS

## 2020-02-27 MED ORDER — PHENYLEPHRINE 40 MCG/ML (10ML) SYRINGE FOR IV PUSH (FOR BLOOD PRESSURE SUPPORT)
PREFILLED_SYRINGE | INTRAVENOUS | Status: DC | PRN
  Administered 2020-02-27 (×4): 80 ug via INTRAVENOUS

## 2020-02-27 MED ORDER — LIDOCAINE 2% (20 MG/ML) 5 ML SYRINGE
INTRAMUSCULAR | Status: DC | PRN
  Administered 2020-02-27: 40 mg via INTRAVENOUS

## 2020-02-27 MED ORDER — PROPOFOL 1000 MG/100ML IV EMUL
INTRAVENOUS | Status: AC
  Filled 2020-02-27: qty 100

## 2020-02-27 SURGICAL SUPPLY — 25 items

## 2020-02-27 NOTE — Op Note (Signed)
[facility] Patient Name: Connie Baker Procedure Date: 02/27/2020 MRN: [id number] Attending MD: Jerene Bears , MD Date of Birth: 1929/03/17 CSN: [id number] Age: 86 Admit Type: Inpatient Procedure:                Colonoscopy Indications:              Heme positive stool, Acute post hemorrhagic anemia Providers:                Lajuan Lines. Hilarie Fredrickson, MD, Cleda Daub, RN, Laverda Sorenson, Technician, Marla Roe, CRNA Referring MD:             [facility] Medicines:                Monitored Anesthesia Care Complications:            No immediate complications. Estimated Blood Loss:     Estimated blood loss: none. Procedure:                Pre-Anesthesia Assessment:                           - Prior to the procedure, a History and Physical                            was performed, and patient medications and                            allergies were reviewed. The patient's tolerance of                            previous anesthesia was also reviewed. The risks                            and benefits of the procedure and the sedation                            options and risks were discussed with the patient.                            All questions were answered, and informed consent                            was obtained. Prior Anticoagulants: The patient has                            taken Xarelto (rivaroxaban), last dose was 4 days                            prior to procedure. ASA Grade Assessment: III - A                            patient with severe systemic disease. After  reviewing the risks and benefits, the patient was                            deemed in satisfactory condition to undergo the                            procedure.                           After obtaining informed consent, the colonoscope                            was passed under direct vision. Throughout the      procedure, the patient's blood pressure, pulse, and                            oxygen saturations were monitored continuously. The                            [id number] Olympus pediatric colonoscope                            was introduced through the anus and advanced to the                            terminal ileum. The colonoscopy was performed                            without difficulty. The patient tolerated the                            procedure well. The quality of the bowel                            preparation was good. The terminal ileum, ileocecal                            valve, appendiceal orifice, and rectum were                            photographed. Scope In: 12:10:08 PM Scope Out: 12:51:25 PM Scope Withdrawal Time: 0 hours 35 minutes 57 seconds  Total Procedure Duration: 0 hours 41 minutes 17 seconds  Findings:      The digital rectal exam was normal.      Red blood was found in the entire colon, with clearly more fresh and       copious blood in the right colon. Copious irrigation and lavage       performed.      There was a trace amount of fresh blood in the very distal terminal       ileum which cleared more proximally with no evidence of bleeding coming       from more proximal small intestine.      After copious irrigation and lavage a single small angioectasia with       bleeding was found in the cecum. This lesion  was located underneath the       ileocecal valve on the cecal side. Fulguration to stop the bleeding by       argon plasma at 0.5 liters/minute and 20 watts was successful.      A 14 mm polyp was found in the proximal sigmoid colon. The polyp was       pedunculated. The polyp was removed with a hot snare. Resection and       retrieval were complete. To prevent bleeding after the polypectomy, one       hemostatic clip was successfully placed. There was no bleeding during,       or at the end, of the polypectomy maneuver.       Multiple small and large-mouthed diverticula were found in the sigmoid       colon and distal descending colon.      The retroflexed view of the distal rectum and anal verge was normal and       showed no anal or rectal abnormalities. Impression:               - Blood in the entire examined colon, identified to                            be coming from cecal angiectasia under the IC valve.                           - The examined portion of the ileum was normal.                           - A single bleeding cecal angioectasia. Treated                            successfully with argon plasma coagulation (APC).                           - One 14 mm polyp in the proximal sigmoid colon,                            removed with a hot snare. Resected and retrieved.                            Clip was placed.                           - Moderate diverticulosis in the sigmoid colon and                            in the descending colon.                           - The distal rectum and anal verge are normal on                            retroflexion view. Moderate Sedation:      Not Applicable - Patient had care per Anesthesia. Recommendation:           - Return patient to hospital  ward for ongoing care.                           - Advance diet as tolerated.                           - Continue present medications.                           - Await pathology results.                           -Consideration to be given as to the risk-benefit                            of resuming Xarelto (rivaroxaban), but given recent                            bleeding and endoscopic therapy would recommend                            holding this for an additional 5 days.                           - No repeat colonoscopy for screening/surveillance                            due to age. Procedure Code(s):        --- Professional ---                           [phone], 59, Colonoscopy, flexible; with control of                             bleeding, any method                           45385, Colonoscopy, flexible; with removal of                            tumor(s), polyp(s), or other lesion(s) by snare                            technique Diagnosis Code(s):        --- Professional ---                           K92.2, Gastrointestinal hemorrhage, unspecified                           K55.21, Angiodysplasia of colon with hemorrhage                           K63.5, Polyp of colon                           R19.5, Other fecal abnormalities  D62, Acute posthemorrhagic anemia                           K57.30, Diverticulosis of large intestine without                            perforation or abscess without bleeding CPT copyright 2019 American Medical Association. All rights reserved. The codes documented in this report are preliminary and upon coder review may  be revised to meet current compliance requirements. Jerene Bears, MD 02/27/2020 1:11:56 PM This report has been signed electronically. Number of Addenda: 0

## 2020-02-27 NOTE — Plan of Care (Signed)
Patient resting in bed, confused to time and place but reorients well. Explained procedure is for 1130 today. Will continue to monitor patient.  Problem: Education: Goal: Knowledge of General Education information will improve Description: Including pain rating scale, medication(s)/side effects and non-pharmacologic comfort measures Outcome: Progressing   Problem: Health Behavior/Discharge Planning: Goal: Ability to manage health-related needs will improve Outcome: Progressing   Problem: Clinical Measurements: Goal: Ability to maintain clinical measurements within normal limits will improve Outcome: Progressing Goal: Will remain free from infection Outcome: Progressing Goal: Diagnostic test results will improve Outcome: Progressing Goal: Respiratory complications will improve Outcome: Progressing Goal: Cardiovascular complication will be avoided Outcome: Progressing   Problem: Activity: Goal: Risk for activity intolerance will decrease Outcome: Progressing   Problem: Nutrition: Goal: Adequate nutrition will be maintained Outcome: Progressing   Problem: Coping: Goal: Level of anxiety will decrease Outcome: Progressing   Problem: Elimination: Goal: Will not experience complications related to bowel motility Outcome: Progressing Goal: Will not experience complications related to urinary retention Outcome: Progressing   Problem: Pain Managment: Goal: General experience of comfort will improve Outcome: Progressing   Problem: Safety: Goal: Ability to remain free from injury will improve Outcome: Progressing   Problem: Skin Integrity: Goal: Risk for impaired skin integrity will decrease Outcome: Progressing

## 2020-02-27 NOTE — Progress Notes (Addendum)
PROGRESS NOTE  Connie Baker [id number] DOB: Apr 05, 1929 DOA: 02/24/2020 PCP: Helen Hashimoto., MD   Brief history: Patient is a 86 year old female with history of paroxysmal A. fib, hyperlipidemia, hypothyroidism who was admitted to [facility] on 11/13 with weakness, hypoxemia, dizziness for 1 week.  Patient was found to have acute anemia.  Baseline hemoglobin is 13, hemoglobin at the facility was 9.0 with heme positive stool.  On the day of transfer to [facility] hemoglobin dropped to 8.1 and was given 1 unit packed RBC transfusion.  Patient was transferred for GI evaluation to [facility].  Patient is on Xarelto which was stopped on admission at [facility], was started on Protonix 40 mg IV every 24 hours. Patient presented with unexplained hypoxemia, 2D echo was obtained which showed mild systolic CHF with EF 40 to 45%, moderate to severe MR.  In 2017, patient had a EF of 65% on Lexiscan stress test.  Patient had normal chest x-ray and normal BNP.  HPI/Recap of past 24 hours:  She denies pain, no fever, no short of breath Is not able to provide detailed history due to baseline dementia Per RN last BM no blood,  She is in A. fib, blood pressure stable She is n.p.o., awaiting for endoscope scheduled this morning Last hemoglobin 8.9 this morning  Assessment/Plan: Principal Problem:   Anemia Active Problems:   GI bleed   Acute systolic CHF (congestive heart failure) (HCC)   AF (paroxysmal atrial fibrillation) (HCC)   Hypothyroidism   Hyperlipidemia   Acute respiratory failure with hypoxia (HCC)   Dementia (HCC)   Chronic anticoagulation  Principal Problem: Symptomatic anemia with GI bleed, acute on chronic blood loss anemia -Baseline hemoglobin~13, presented with hemoglobin of 9.6,  down to 8.1 on the day of transfer.  She received 1 unit PRBC prior to transfer - Patient had reported black tarry stool, FOBT positive.   --Patient was transferred to [facility], GI consulted -Patient was placed  on IV PPI. -On 3/14 p.m. patient started having active rectal bleeding, painless, was transferred to stepdown unit.  H&H remained stable -GI following, plan for EGD and colonoscopy on 3/16,  -Xarelto currently on hold    Active Problems: Systolic CHF (congestive heart failure) ([place]) -2D echo done during the admission at [facility] showed EF of 45 to 50%, apically akinesis, moderate to severe mitral regurgitation - Recommend outpatient follow-up with her cardiologist, currently no anginal symptoms, no acute shortness of breath, chest x-ray at [facility] showed no pulmonary edema, she has no edema on physical exam - hold Lasix for now (was placed on 20 mg p.o. daily)  Hypernatremia -Likely from dehydration, continue gentle hydration with d5/lr at 50cc/hr, hold diuretics, repeat labs in a.m.  Paroxysmal AF (paroxysmal atrial fibrillation) (HCC) with RVR -Patient had reported issues with palpitations for last several years -Xarelto on hold due to #1 -Beta-blocker was held at [facility] due to borderline BP.    She has intermittent A. fib with RVR, metoprolol restarted 25 mg p.o. twice daily (home dose 50 mg PO BID), titrate as tolerated with BP.      Hypothyroidism -Continue Synthroid, TSH normal    Hyperlipidemia -Continue Pravachol  Dementia (HCC) -Currently stable, son does not believe patient has dementia, per son "patient has memory issues and word finding troubles at baseline", patient appears at baseline currently  History of DVT, on chronic anticoagulation -Xarelto currently on hold   DVT Prophylaxis: SCDs  Code Status: Full  Family Communication: son over the  phone  Disposition Plan:    Patient came from:     home    by herself, walks with a walker, son lives next door                                                                                                   Anticipated d/c place:  To be determined, need PT eval, likely will need home health   Barriers to d/c OR conditions  which need to be met to effect a safe d/c:  Need EGD and colonoscopy   Consultants:  GI  Procedures:  EGD and colonoscopy on March 16  Antibiotics:  None   Objective: BP (!) 142/62   Pulse 89   Temp 97.9 F (36.6 C) (Oral)   Resp 16   Ht '5\' 2"'  (1.575 m)   Wt 110 kg   SpO2 95%   BMI 44.35 kg/m   Intake/Output Summary (Last 24 hours) at 02/27/2020 0703 Last data filed at 02/27/2020 0700 Gross per 24 hour  Intake 1952.43 ml  Output 500 ml  Net 1452.43 ml   Filed Weights   02/26/20 1449  Weight: 110 kg    Exam: Patient is examined daily including today on 02/27/2020, exams remain the same as of yesterday except that has changed    General:  NAD, oriented to person only, calm and cooperative  Cardiovascular: IRRR  Respiratory: CTABL  Abdomen: Soft/ND/NT, positive BS  Musculoskeletal: No Edema  Neuro: alert, oriented to person only  Data Reviewed: Basic Metabolic Panel: Recent Labs  Lab 02/25/20 0556 02/26/20 0009  NA 144 146*  K 4.2 4.1  CL 109 109  CO2 26 27  GLUCOSE 98 114*  BUN 24* 20  CREATININE 0.85 0.88  CALCIUM 8.1* 8.2*   Liver Function Tests: No results for input(s): AST, ALT, ALKPHOS, BILITOT, PROT, ALBUMIN in the last 168 hours. No results for input(s): LIPASE, AMYLASE in the last 168 hours. No results for input(s): AMMONIA in the last 168 hours. CBC: Recent Labs  Lab 02/24/20 1710 02/24/20 1710 02/25/20 0556 02/25/20 0556 02/25/20 1915 02/26/20 0009 02/26/20 0539 02/26/20 1908 02/27/20 0027  WBC 6.2  --  6.5  --   --  6.1  --   --   --   HGB 9.4*   < > 8.8*   < > 10.1* 9.4* 9.4* 9.5* 8.9*  HCT 30.3*   < > 28.6*   < > 31.5* 29.2* 29.9* 29.9* 28.3*  MCV 103.8*  --  104.8*  --   --  102.5*  --   --   --   PLT 250  --  228  --   --  251  --   --   --    < > = values in this interval not displayed.   Cardiac Enzymes:   No results for input(s): CKTOTAL, CKMB, CKMBINDEX, TROPONINI in the last 168 hours. BNP (last 3  results) No results for input(s): BNP in the last 8760 hours.  ProBNP (last 3 results) No results for input(s): PROBNP in the last 8760 hours.  CBG: No results for input(s): GLUCAP in the last 168 hours.  Recent Results (from the past 240 hour(s))  MRSA PCR Screening     Status: None   Collection Time: 02/25/20  9:08 PM   Specimen: Nasal Mucosa; Nasopharyngeal  Result Value Ref Range Status   MRSA by PCR NEGATIVE NEGATIVE Final    Comment:        The GeneXpert MRSA Assay (FDA approved for NASAL specimens only), is one component of a comprehensive MRSA colonization surveillance program. It is not intended to diagnose MRSA infection nor to guide or monitor treatment for MRSA infections. Performed at [facility], [place] [address] [place], [place] [address]      Studies: No results found.  Scheduled Meds: . Chlorhexidine Gluconate Cloth  6 each Topical Daily  . levothyroxine  25 mcg Oral Q0600  . memantine  10 mg Oral Daily  . metoprolol tartrate  25 mg Oral BID  . pantoprazole (PROTONIX) IV  40 mg Intravenous Q12H  . pravastatin  20 mg Oral Daily    Continuous Infusions: . sodium chloride 20 mL/hr at 02/27/20 0700  . sodium chloride 100 mL/hr at 02/26/20 0356     Time spent: 24mns I have personally reviewed and interpreted on  02/27/2020 daily labs, tele strips, imagings as discussed above under date review session and assessment and plans.  I reviewed all nursing notes, pharmacy notes, consultant notes,  vitals, pertinent old records  I have discussed plan of care as described above with RN , patient and family on 02/27/2020   FFlorencia ReasonsMD, PhD, FACP  [facility]  Available via Epic secure chat 7am-7pm for nonurgent issues Please page for urgent issues, pager number available through [email or web address] .   02/27/2020, 7:03 AM  LOS: 3 days

## 2020-02-27 NOTE — Anesthesia Preprocedure Evaluation (Addendum)
Anesthesia Evaluation  Patient identified by MRN, date of birth, ID band Patient awake    Reviewed: Allergy & Precautions, NPO status , Patient's Chart, lab work & pertinent test results  Airway Mallampati: II  TM Distance: >3 FB Neck ROM: Full    Dental no notable dental hx.    Pulmonary neg pulmonary ROS, former smoker,    Pulmonary exam normal breath sounds clear to auscultation       Cardiovascular hypertension, Pt. on medications and Pt. on home beta blockers + CAD  + dysrhythmias Atrial Fibrillation  Rhythm:Irregular Rate:Normal     Neuro/Psych Dementia CVA    GI/Hepatic negative GI ROS, Neg liver ROS,   Endo/Other  Hypothyroidism Morbid obesity  Renal/GU negative Renal ROS  negative genitourinary   Musculoskeletal negative musculoskeletal ROS (+)   Abdominal   Peds negative pediatric ROS (+)  Hematology negative hematology ROS (+) anemia ,   Anesthesia Other Findings   Reproductive/Obstetrics negative OB ROS                            Anesthesia Physical Anesthesia Plan  ASA: III  Anesthesia Plan: MAC   Post-op Pain Management:    Induction: Intravenous  PONV Risk Score and Plan: 0  Airway Management Planned: Simple Face Mask  Additional Equipment:   Intra-op Plan:   Post-operative Plan:   Informed Consent: I have reviewed the patients History and Physical, chart, labs and discussed the procedure including the risks, benefits and alternatives for the proposed anesthesia with the patient or authorized representative who has indicated his/her understanding and acceptance.     Dental advisory given  Plan Discussed with: CRNA and Surgeon  Anesthesia Plan Comments:         Anesthesia Quick Evaluation

## 2020-02-27 NOTE — Transfer of Care (Signed)
Immediate Anesthesia Transfer of Care Note  Patient: Connie Baker  Procedure(s) Performed: ESOPHAGOGASTRODUODENOSCOPY (EGD) WITH PROPOFOL (N/A ) COLONOSCOPY WITH PROPOFOL (N/A ) HOT HEMOSTASIS (ARGON PLASMA COAGULATION/BICAP) (N/A ) POLYPECTOMY HEMOSTASIS CLIP PLACEMENT  Patient Location: PACU  Anesthesia Type:MAC  Level of Consciousness: sedated  Airway & Oxygen Therapy: Patient Spontanous Breathing and Patient connected to face mask oxygen  Post-op Assessment: Report given to RN and Post -op Vital signs reviewed and stable  Post vital signs: Reviewed and stable  Last Vitals:  Vitals Value Taken Time  BP    Temp    Pulse 105 02/27/20 1259  Resp    SpO2 100 % 02/27/20 1259  Vitals shown include unvalidated device data.  Last Pain:  Vitals:   02/27/20 1104  TempSrc: Oral  PainSc: 0-No pain      Patients Stated Pain Goal: 0 (76/81/15 7262)  Complications: No apparent anesthesia complications

## 2020-02-27 NOTE — Anesthesia Procedure Notes (Signed)
Date/Time: 02/27/2020 11:55 AM Performed by: Talbot Grumbling, CRNA Oxygen Delivery Method: Simple face mask

## 2020-02-27 NOTE — Anesthesia Postprocedure Evaluation (Signed)
Anesthesia Post Note  Patient: Connie Baker  Procedure(s) Performed: ESOPHAGOGASTRODUODENOSCOPY (EGD) WITH PROPOFOL (N/A ) COLONOSCOPY WITH PROPOFOL (N/A ) HOT HEMOSTASIS (ARGON PLASMA COAGULATION/BICAP) (N/A ) POLYPECTOMY HEMOSTASIS CLIP PLACEMENT     Patient location during evaluation: PACU Anesthesia Type: MAC Level of consciousness: awake and alert Pain management: pain level controlled Vital Signs Assessment: post-procedure vital signs reviewed and stable Respiratory status: spontaneous breathing, nonlabored ventilation, respiratory function stable and patient connected to nasal cannula oxygen Cardiovascular status: stable and blood pressure returned to baseline Postop Assessment: no apparent nausea or vomiting Anesthetic complications: no    Last Vitals:  Vitals:   02/27/20 1320 02/27/20 1322  BP: 94/79   Pulse: (!) 111 (!) 109  Resp: (!) 24 19  Temp:    SpO2: (!) 86% 100%    Last Pain:  Vitals:   02/27/20 1320  TempSrc:   PainSc: 0-No pain                 [name] S

## 2020-02-27 NOTE — Op Note (Signed)
[facility] Patient Name: Connie Baker Procedure Date: 02/27/2020 MRN: [id number] Attending MD: Jerene Bears , MD Date of Birth: 11/10/1929 CSN: [id number] Age: 86 Admit Type: Inpatient Procedure:                Upper GI endoscopy Indications:              Acute post hemorrhagic anemia, Heme positive stool Providers:                Lajuan Lines. Hilarie Fredrickson, MD, Cleda Daub, RN, Laverda Sorenson, Technician, Marla Roe, CRNA Referring MD:             [facility] Medicines:                Monitored Anesthesia Care Complications:            No immediate complications. Estimated Blood Loss:     Estimated blood loss: none. Procedure:                Pre-Anesthesia Assessment:                           - Prior to the procedure, a History and Physical                            was performed, and patient medications and                            allergies were reviewed. The patient's tolerance of                            previous anesthesia was also reviewed. The risks                            and benefits of the procedure and the sedation                            options and risks were discussed with the patient.                            All questions were answered, and informed consent                            was obtained. Prior Anticoagulants: The patient has                            taken Xarelto (rivaroxaban), last dose was 4 days                            prior to procedure. ASA Grade Assessment: III - A                            patient with severe systemic disease. After  reviewing the risks and benefits, the patient was                            deemed in satisfactory condition to undergo the                            procedure.                           After obtaining informed consent, the endoscope was                            passed under direct vision. Throughout the           procedure, the patient's blood pressure, pulse, and                            oxygen saturations were monitored continuously. The                            [id number] Olympus gastroscope was                            introduced through the mouth, and advanced to the                            second part of duodenum. The upper GI endoscopy was                            accomplished without difficulty. The patient                            tolerated the procedure well. Scope In: Scope Out: Findings:      The examined esophagus was normal.      The entire examined stomach was normal.      The examined duodenum was normal. Impression:               - Normal esophagus.                           - Normal stomach.                           - Normal examined duodenum.                           - No specimens collected. Moderate Sedation:      N/A Recommendation:           - Return patient to hospital ward for ongoing care.                           - See the other procedure note for documentation of                            additional recommendations. Procedure Code(s):        --- Professional ---  [id number], Esophagogastroduodenoscopy, flexible,                            transoral; diagnostic, including collection of                            specimen(s) by brushing or washing, when performed                            (separate procedure) Diagnosis Code(s):        --- Professional ---                           D62, Acute posthemorrhagic anemia                           R19.5, Other fecal abnormalities CPT copyright 2019 American Medical Association. All rights reserved. The codes documented in this report are preliminary and upon coder review may  be revised to meet current compliance requirements. Jerene Bears, MD 02/27/2020 1:01:46 PM This report has been signed electronically. Number of Addenda: 0

## 2020-02-27 NOTE — Interval H&P Note (Signed)
History and Physical Interval Note: For egd/colonoscopy today to eval GI bleeding.  Symptomatic anemia in the setting of heme +, dark stools while on Xarelto.  S/p PRBC  CBC Latest Ref Rng & Units 02/27/2020 02/27/2020 02/26/2020  WBC 4.0 - 10.5 K/uL 5.3 - -  Hemoglobin 12.0 - 15.0 g/dL 8.9(L) 8.9(L) 9.5(L)  Hematocrit 36.0 - 46.0 % 28.6(L) 28.3(L) 29.9(L)  Platelets 150 - 400 K/uL 244 - -     HIGHER THAN BASELINE RISK.The nature of the procedure, as well as the risks, benefits, and alternatives were carefully and thoroughly reviewed with the patient and her family. Ample time for discussion and questions allowed. The patient and family understood, were satisfied, and agreed to proceed.    02/27/2020 11:41 AM  Connie Baker  has presented today for surgery, with the diagnosis of Anemia and heme positive stools.  The various methods of treatment have been discussed with the patient and family. After consideration of risks, benefits and other options for treatment, the patient has consented to  Procedure(s): ESOPHAGOGASTRODUODENOSCOPY (EGD) WITH PROPOFOL (N/A) COLONOSCOPY WITH PROPOFOL (N/A) as a surgical intervention.  The patient's history has been reviewed, patient examined, no change in status, stable for surgery.  I have reviewed the patient's chart and labs.  Questions were answered to the patient's satisfaction.     Lajuan Lines Darrold Bezek

## 2020-02-27 NOTE — TOC Progression Note (Signed)
Received consult requesting TOC speak with family about options for SNF, ALF, etc at dc. PT consult is ordered. Once PT recommendations are in, TOC will follow up with pt's family.

## 2020-02-27 NOTE — Progress Notes (Signed)
I spoke with the patient's daughter Lynelle Smoke and the patient's son Marlou Sa by phone, separately, after the procedure.  I made them both aware of the findings of the upper endoscopy and colonoscopy.  My recommendation to hold Xarelto for 5 additional days with close observation should this medicine be resumed.  Tammy mentioned facing a difficult decision regarding where her mother should be living after hospitalization.  They are aware of her dementia and most recently she has been living alone.  They feel that this is not the ideal living situation for her and are interested in finding out what benefits or other facilities may be available for her to receive care.  I am placing a social work consult to help the family address these concerns  GI will check on her tomorrow

## 2020-02-28 DIAGNOSIS — I480 Paroxysmal atrial fibrillation: Secondary | ICD-10-CM

## 2020-02-28 DIAGNOSIS — F0150 Vascular dementia without behavioral disturbance: Secondary | ICD-10-CM

## 2020-02-28 LAB — CBC
HCT: 25.6 % — ABNORMAL LOW (ref 36.0–46.0)
Hemoglobin: 7.7 g/dL — ABNORMAL LOW (ref 12.0–15.0)
MCH: 32.4 pg (ref 26.0–34.0)
MCHC: 30.1 g/dL (ref 30.0–36.0)
MCV: 107.6 fL — ABNORMAL HIGH (ref 80.0–100.0)
Platelets: 212 10*3/uL (ref 150–400)
RBC: 2.38 MIL/uL — ABNORMAL LOW (ref 3.87–5.11)
RDW: 14.6 % (ref 11.5–15.5)
WBC: 5.2 10*3/uL (ref 4.0–10.5)
nRBC: 0 % (ref 0.0–0.2)

## 2020-02-28 LAB — BASIC METABOLIC PANEL
Anion gap: 6 (ref 5–15)
BUN: 20 mg/dL (ref 8–23)
CO2: 23 mmol/L (ref 22–32)
Calcium: 8.2 mg/dL — ABNORMAL LOW (ref 8.9–10.3)
Chloride: 114 mmol/L — ABNORMAL HIGH (ref 98–111)
Creatinine, Ser: 0.76 mg/dL (ref 0.44–1.00)
GFR calc Af Amer: >60 mL/min (ref >60)
GFR calc non Af Amer: >60 mL/min (ref >60)
Glucose, Bld: 99 mg/dL (ref 70–99)
Potassium: 3.7 mmol/L (ref 3.5–5.1)
Sodium: 143 mmol/L (ref 135–145)

## 2020-02-28 LAB — SURGICAL PATHOLOGY

## 2020-02-28 LAB — HEMOGLOBIN AND HEMATOCRIT, BLOOD
HCT: 27.9 % — ABNORMAL LOW (ref 36.0–46.0)
Hemoglobin: 8.6 g/dL — ABNORMAL LOW (ref 12.0–15.0)

## 2020-02-28 NOTE — Progress Notes (Signed)
PROGRESS NOTE  Connie Baker [id number] DOB: 09-21-1929 DOA: 02/24/2020 PCP: Helen Hashimoto., MD   HPI/Recap of past 24 hours Patient is a 86 year old female with history of paroxysmal A. fib, hyperlipidemia, hypothyroidism who was admitted to [facility] on 11/13 with weakness, hypoxemia, dizziness for 1 week.  Patient was found to have acute anemia. Baseline hemoglobin is 13, hemoglobin at the facility was 9.0 with heme positive stool. On the day of transfer to [facility] hemoglobin dropped to 8.1 and was given 1 unit packed RBC transfusion.  Patient was transferred for GI evaluation to [facility]. Patient is on Xarelto which was stopped on admission at [facility]. Patient presented with unexplained hypoxemia, 2D echo was obtained which showed mild systolic CHF with EF 40 to 45%, moderate to severe MR.  In 2017, patient had a EF of 65% on Lexiscan stress test.  Patient had normal chest x-ray and normal BNP.    Today, met patient at bedside with PT, was able to move from bed to chair.  Reports feeling okay, still with generalized weakness.  No BM since 02/27/2020.  Denies any abdominal pain, chest pain, shortness of breath, nausea/vomiting, fever/chills.   Assessment/Plan: Principal Problem:   Anemia Active Problems:   GI bleed   Acute systolic CHF (congestive heart failure) (HCC)   AF (paroxysmal atrial fibrillation) (HCC)   Hypothyroidism   Hyperlipidemia   Acute respiratory failure with hypoxia (HCC)   Dementia (HCC)   Chronic anticoagulation   Heme positive stool   Angiodysplasia of colon with hemorrhage   Benign neoplasm of sigmoid colon   Symptomatic anemia with GI bleed, acute on chronic blood loss anemia Baseline hemoglobin~13, presented with hemoglobin of 9.6-->8.1--> 7.7 FOBT positive S/p 1 unit of PRBC prior to transfer On 3/14 p.m. patient started having active rectal bleeding, painless GI on board, status post EGD/colonoscopy on 02/27/2020 which showed a single bleeding  cecal angiectasia, treated successfully with APC, 114 mm polyp in the proximal sigmoid colon removed with a hot snare, moderate diverticulosis.  EGD unremarkable GI had extensive discussion with daughter, agreed to discontinue home Xarelto and switch to low-dose aspirin.  Increased risk of stroke explained to daughter, who verbalized understanding.  Patient may begin aspirin 5 to 7 days Daily CBC  ?Chronic systolic CHF 2D echo done during the admission at [facility] showed EF of 45 to 50%, apically akinesis, moderate to severe mitral regurgitation Recommend outpatient follow-up with her cardiologist, currently no anginal symptoms, no acute shortness of breath, chest x-ray at [facility] showed no pulmonary edema Hold Lasix for now (was placed on 20 mg p.o. daily)  Paroxysmal AF with RVR HR is still uncontrolled Continue metoprolol 25 mg p.o. twice daily (home dose 50 mg PO BID), titrate as tolerated with BP Xarelto currently discontinued Continue telemetry  History of DVT Xarelto currently discontinued  Hypothyroidism TSH WNL Continue Synthroid  Hyperlipidemia Continue Pravachol  Dementia Currently stable, son does not believe patient has dementia, per son "patient has memory issues and word finding troubles at baseline", patient appears at baseline currently  Morbid obesity Lifestyle modification advised      DVT Prophylaxis: SCDs  Code Status: Full  Family Communication: GI spoke to daughter at bedside on 02/28/2020  Disposition Plan:    Patient came from: home by herself, walks with a walker, son lives next door  Anticipated d/c place: Awaiting PT eval, may need SNF  Barriers to d/c OR conditions which need to be met to effect a safe d/c:  GI signing off, awaiting PT eval   Consultants:  GI  Procedures:  EGD and colonoscopy on March  16  Antibiotics:  None   Objective: BP (!) 103/54   Pulse (!) 104   Temp 98.2 F (36.8 C) (Axillary)   Resp 19   Ht '5\' 2"'  (1.575 m)   Wt 110 kg   SpO2 96%   BMI 44.35 kg/m   Intake/Output Summary (Last 24 hours) at 02/28/2020 1247 Last data filed at 02/28/2020 1235 Gross per 24 hour  Intake 1895.15 ml  Output 300 ml  Net 1595.15 ml   Filed Weights   02/26/20 1449 02/27/20 1104  Weight: 110 kg 110 kg    Exam:  General: NAD   Cardiovascular: S1, S2 present  Respiratory: CTAB  Abdomen: Soft, nontender, nondistended, bowel sounds present  Musculoskeletal: No bilateral pedal edema noted  Skin: Normal  Psychiatry: Normal mood   Data Reviewed: Basic Metabolic Panel: Recent Labs  Lab 02/25/20 0556 02/26/20 0009 02/27/20 0740 02/28/20 0152  NA 144 146* 149* 143  K 4.2 4.1 4.2 3.7  CL 109 109 116* 114*  CO2 '26 27 24 23  ' GLUCOSE 98 114* 114* 99  BUN 24* '20 20 20  ' CREATININE 0.85 0.88 1.00 0.76  CALCIUM 8.1* 8.2* 8.6* 8.2*   Liver Function Tests: No results for input(s): AST, ALT, ALKPHOS, BILITOT, PROT, ALBUMIN in the last 168 hours. No results for input(s): LIPASE, AMYLASE in the last 168 hours. No results for input(s): AMMONIA in the last 168 hours. CBC: Recent Labs  Lab 02/24/20 1710 02/24/20 1710 02/25/20 0556 02/25/20 1915 02/26/20 0009 02/26/20 0009 02/26/20 0539 02/26/20 1908 02/27/20 0027 02/27/20 0740 02/28/20 0152  WBC 6.2  --  6.5  --  6.1  --   --   --   --  5.3 5.2  HGB 9.4*   < > 8.8*   < > 9.4*   < > 9.4* 9.5* 8.9* 8.9* 7.7*  HCT 30.3*   < > 28.6*   < > 29.2*   < > 29.9* 29.9* 28.3* 28.6* 25.6*  MCV 103.8*  --  104.8*  --  102.5*  --   --   --   --  106.7* 107.6*  PLT 250  --  228  --  251  --   --   --   --  244 212   < > = values in this interval not displayed.   Cardiac Enzymes:   No results for input(s): CKTOTAL, CKMB, CKMBINDEX, TROPONINI in the last 168 hours. BNP (last 3 results) No results for input(s): BNP in the  last 8760 hours.  ProBNP (last 3 results) No results for input(s): PROBNP in the last 8760 hours.  CBG: No results for input(s): GLUCAP in the last 168 hours.  Recent Results (from the past 240 hour(s))  MRSA PCR Screening     Status: None   Collection Time: 02/25/20  9:08 PM   Specimen: Nasal Mucosa; Nasopharyngeal  Result Value Ref Range Status   MRSA by PCR NEGATIVE NEGATIVE Final    Comment:        The GeneXpert MRSA Assay (FDA approved for NASAL specimens only), is one component of a comprehensive MRSA colonization surveillance program. It is not intended to diagnose MRSA infection nor to guide or monitor treatment for MRSA  infections. Performed at [facility], [place] [address] [place], [place] [address]      Studies: No results found.  Scheduled Meds: . Chlorhexidine Gluconate Cloth  6 each Topical Daily  . levothyroxine  25 mcg Oral Q0600  . memantine  10 mg Oral Daily  . metoprolol tartrate  25 mg Oral BID  . pravastatin  20 mg Oral Daily    Continuous Infusions: . dextrose 5% lactated ringers 50 mL/hr at 02/28/20 0700      Alma Friendly MD  [facility]   02/28/2020, 12:47 PM  LOS: 4 days

## 2020-02-28 NOTE — Progress Notes (Addendum)
Progress Note   Subjective  Chief Complaint: GI bleed, weakness and dizziness x1 week; status post EGD and colonoscopy 02/27/2020 with APC of cecal AVM  Today, the patient tells me that she feels "fair".  Continues to tell me she feels weak.  Tells me she has not had a bowel movement since time of the procedures yesterday.  Also tells me that she just "cannot think straight" and she wonders what plans are when she leaves the hospital.  Denies any new complaints or concerns including abdominal pain.  Spoke with nursing staff who confirms that she has not had a BM since before colonoscopy yesterday.   Objective   Vital signs in last 24 hours: Temp:  [97.7 F (36.5 C)-98.9 F (37.2 C)] 98.2 F (36.8 C) (03/17 0656) Pulse Rate:  [88-122] 122 (03/17 0900) Resp:  [12-24] 19 (03/17 0900) BP: (88-161)/(46-137) 161/137 (03/17 0900) SpO2:  [86 %-100 %] 95 % (03/17 0900) Weight:  [110 kg] 110 kg (03/16 1104) Last BM Date: 02/27/20 General:   Elderly Caucasian female in NAD Heart:  Tachycardia; no murmurs Lungs: Respirations even and unlabored, lungs CTA bilaterally Abdomen:  Soft, nontender and nondistended. Normal bowel sounds. Extremities:  Without edema. Neurologic:  Alert and oriented,  grossly normal neurologically. Psych:  Cooperative. Normal mood and affect.  Intake/Output from previous day: 03/16 0701 - 03/17 0700 In: 1395.2 [I.V.:1395.2] Out: 300 [Urine:300]  Lab Results: Recent Labs    02/26/20 0009 02/26/20 0539 02/27/20 0027 02/27/20 0740 02/28/20 0152  WBC 6.1  --   --  5.3 5.2  HGB 9.4*   < > 8.9* 8.9* 7.7*  HCT 29.2*   < > 28.3* 28.6* 25.6*  PLT 251  --   --  244 212   < > = values in this interval not displayed.   BMET Recent Labs    02/26/20 0009 02/27/20 0740 02/28/20 0152  NA 146* 149* 143  K 4.1 4.2 3.7  CL 109 116* 114*  CO2 27 24 23   GLUCOSE 114* 114* 99  BUN 20 20 20   CREATININE 0.88 1.00 0.76  CALCIUM 8.2* 8.6* 8.2*    Colonoscopy  02/27/2020  Impression:               - Blood in the entire examined colon, identified to                            be coming from cecal angiectasia under the IC valve.                           - The examined portion of the ileum was normal.                           - A single bleeding cecal angioectasia. Treated                            successfully with argon plasma coagulation (APC).                           - One 14 mm polyp in the proximal sigmoid colon,                            removed with  a hot snare. Resected and retrieved.                            Clip was placed.                           - Moderate diverticulosis in the sigmoid colon and                            in the descending colon.                           - The distal rectum and anal verge are normal on                            retroflexion view. Recommendation:           - Return patient to hospital ward for ongoing care.                           - Advance diet as tolerated.                           - Continue present medications.                           - Await pathology results.                           -Consideration to be given as to the risk-benefit                            of resuming Xarelto (rivaroxaban), but given recent                            bleeding and endoscopic therapy would recommend                            holding this for an additional 5 days.                           - No repeat colonoscopy for screening/surveillance                            due to age.  EGD 02/27/2020 Impression:               - Normal esophagus.                           - Normal stomach.                           - Normal examined duodenum.                           - No specimens collected.  Recommendation:           - Return patient to  hospital ward for ongoing care.                           - See the other procedure note for documentation of                            additional  recommendations.   Assessment / Plan:   Assessment: 1.  GI bleed: 2 units PRBCs at [facility] and another 2 units since admission, Xarelto on hold, no further overt bleeding overnight after colonoscopy yesterday with findings of cecal AVM and APC, hemoglobin 8.9--> 7.7 overnight 2.  Weakness/dizziness: Due to above 3.  A. fib: Typically on Xarelto, current recommendations to hold for an additional 5 days 4.  Dementia  Plan: 1.  Continue to monitor hemoglobin with transfusion as needed less than 7 2.  Continue regular diet 3.  Continue to hold Xarelto for another 5 days as per Dr. Vena Rua recommendations 4.  Discharge has been discussed with the family and social work has been consulted to discuss possible assisted living facility 5.  Please await further recommendations from Dr. Hilarie Fredrickson later today  Thank you for your kind consultation.   LOS: 4 days   Levin Erp  02/28/2020, 9:14 AM

## 2020-02-28 NOTE — Evaluation (Signed)
Physical Therapy Evaluation Patient Details Name: Connie Baker MRN: [id number] DOB: 06-Apr-1929 Today's Date: 02/28/2020   History of Present Illness  Patient is a 86 year old female with history of paroxysmal A. fib, hyperlipidemia, hypothyroidism, stroke, GIBeeld. who was admitted 02/24/20 with anemia, hypoxia, dizziness.  Clinical Impression  The patient is p[leasant and able to mobilize to recliner with mod assist. Per notes, Patient was living independently PTA. Patient most likely will need 24/7 assistance. Family not present. Pt admitted with above diagnosis.  Pt currently with functional limitations due to the deficits listed below (see PT Problem List). Pt will benefit from skilled PT to increase their independence and safety with mobility to allow discharge to the venue listed below.       Follow Up Recommendations SNF;Supervision/Assistance - 24 hour  Vs. HHPT with 24/7    Equipment Recommendations  None recommended by PT    Recommendations for Other Services       Precautions / Restrictions Precautions Precautions: Fall      Mobility  Bed Mobility Overal bed mobility: Needs Assistance Bed Mobility: Supine to Sit     Supine to sit: HOB elevated;Min assist     General bed mobility comments: extra time  Transfers Overall transfer level: Needs assistance Equipment used: Rolling walker (2 wheeled) Transfers: Sit to/from [facility] Sit to Stand: Mod assist;+2 safety/equipment Stand pivot transfers: Mod assist;+2 safety/equipment       General transfer comment: Steady assist to rise from bed and take several small steps to recliner  Ambulation/Gait                Stairs            Wheelchair Mobility    Modified Rankin (Stroke Patients Only)       Balance Overall balance assessment: Needs assistance Sitting-balance support: Feet supported;No upper extremity supported Sitting balance-Leahy Scale: Good     Standing  balance support: Bilateral upper extremity supported;During functional activity Standing balance-Leahy Scale: Fair                               Pertinent Vitals/Pain Pain Assessment: Faces Faces Pain Scale: Hurts little more Pain Location: just everywhere Pain Descriptors / Indicators: Discomfort Pain Intervention(s): Monitored during session    Home Living Family/patient expects to be discharged to:: Private residence(plans are for [facility]) Living Arrangements: Alone;Other (Comment) Available Help at Discharge: Family;Available PRN/intermittently Type of Home: House       Home Layout: One level   Additional Comments: unsure of home, patient unable to provide.    Prior Function Level of Independence: Needs assistance   Gait / Transfers Assistance Needed: did not amb with RW per patient  ADL's / Homemaking Assistance Needed: unsure        Hand Dominance   Dominant Hand: Right    Extremity/Trunk Assessment   Upper Extremity Assessment Upper Extremity Assessment: Generalized weakness    Lower Extremity Assessment Lower Extremity Assessment: Generalized weakness    Cervical / Trunk Assessment Cervical / Trunk Assessment: Normal  Communication   Communication: Expressive difficulties(at times, expression was not sensible " I've gone to the dogs" stated when she had difficulty answering questions)  Cognition Arousal/Alertness: Awake/alert Behavior During Therapy: WFL for tasks assessed/performed Overall Cognitive Status: No family/caregiver present to determine baseline cognitive functioning Area of Impairment: Orientation;Attention;Memory;Following commands;Safety/judgement;Awareness;Problem solving  Orientation Level: Place;Time;Situation Current Attention Level: Sustained Memory: Decreased short-term memory Following Commands: Follows one step commands consistently Safety/Judgement: Decreased awareness of deficits;Decreased  awareness of safety Awareness: Intellectual Problem Solving: Difficulty sequencing;Requires verbal cues General Comments: generally ablwe to participate  and follow directions      General Comments      Exercises     Assessment/Plan    PT Assessment Patient needs continued PT services  PT Problem List Decreased strength;Decreased cognition;Decreased knowledge of precautions;Decreased mobility;Decreased knowledge of use of DME;Decreased activity tolerance;Decreased safety awareness       PT Treatment Interventions DME instruction;Functional mobility training;Gait training;Therapeutic activities;Patient/family education;Therapeutic exercise;Cognitive remediation    PT Goals (Current goals can be found in the Care Plan section)  Acute Rehab PT Goals Patient Stated Goal: Agreed to getting OOB PT Goal Formulation: Patient unable to participate in goal setting Time For Goal Achievement: 03/13/20 Potential to Achieve Goals: Fair    Frequency Min 2X/week   Barriers to discharge Decreased caregiver support      Co-evaluation               AM-PAC PT "6 Clicks" Mobility  Outcome Measure Help needed turning from your back to your side while in a flat bed without using bedrails?: A Little Help needed moving from lying on your back to sitting on the side of a flat bed without using bedrails?: A Little Help needed moving to and from a bed to a chair (including a wheelchair)?: A Lot Help needed standing up from a chair using your arms (e.g., wheelchair or bedside chair)?: A Lot Help needed to walk in hospital room?: A Lot Help needed climbing 3-5 steps with a railing? : Total 6 Click Score: 13    End of Session Equipment Utilized During Treatment: Gait belt Activity Tolerance: Patient tolerated treatment well Patient left: in chair;with call bell/phone within reach;with chair alarm set;with nursing/sitter in room Nurse Communication: Mobility status PT Visit Diagnosis:  Unsteadiness on feet (R26.81);Difficulty in walking, not elsewhere classified (R26.2)    Time: [id number] PT Time Calculation (min) (ACUTE ONLY): 42 min   Charges:   PT Evaluation $PT Eval Low Complexity: 1 Low PT Treatments $Therapeutic Activity: 23-37 mins         Connie Baker PT Acute Rehabilitation Services Pager [phone] Office [phone]   Connie Baker 02/28/2020, 1:14 PM

## 2020-02-29 ENCOUNTER — Encounter: Payer: Self-pay | Admitting: *Deleted

## 2020-02-29 LAB — CBC WITH DIFFERENTIAL/PLATELET
Abs Immature Granulocytes: 0.01 10*3/uL (ref 0.00–0.07)
Basophils Absolute: 0 10*3/uL (ref 0.0–0.1)
Basophils Relative: 0 %
Eosinophils Absolute: 0.2 10*3/uL (ref 0.0–0.5)
Eosinophils Relative: 5 %
HCT: 24.6 % — ABNORMAL LOW (ref 36.0–46.0)
Hemoglobin: 7.9 g/dL — ABNORMAL LOW (ref 12.0–15.0)
Immature Granulocytes: 0 %
Lymphocytes Relative: 37 %
Lymphs Abs: 1.7 10*3/uL (ref 0.7–4.0)
MCH: 33.1 pg (ref 26.0–34.0)
MCHC: 32.1 g/dL (ref 30.0–36.0)
MCV: 102.9 fL — ABNORMAL HIGH (ref 80.0–100.0)
Monocytes Absolute: 0.5 10*3/uL (ref 0.1–1.0)
Monocytes Relative: 11 %
Neutro Abs: 2.2 10*3/uL (ref 1.7–7.7)
Neutrophils Relative %: 47 %
Platelets: 216 10*3/uL (ref 150–400)
RBC: 2.39 MIL/uL — ABNORMAL LOW (ref 3.87–5.11)
RDW: 14.1 % (ref 11.5–15.5)
WBC: 4.6 10*3/uL (ref 4.0–10.5)
nRBC: 0 % (ref 0.0–0.2)

## 2020-02-29 LAB — BASIC METABOLIC PANEL
Anion gap: 6 (ref 5–15)
BUN: 23 mg/dL (ref 8–23)
CO2: 23 mmol/L (ref 22–32)
Calcium: 7.8 mg/dL — ABNORMAL LOW (ref 8.9–10.3)
Chloride: 112 mmol/L — ABNORMAL HIGH (ref 98–111)
Creatinine, Ser: 0.73 mg/dL (ref 0.44–1.00)
GFR calc Af Amer: >60 mL/min (ref >60)
GFR calc non Af Amer: >60 mL/min (ref >60)
Glucose, Bld: 98 mg/dL (ref 70–99)
Potassium: 3.8 mmol/L (ref 3.5–5.1)
Sodium: 141 mmol/L (ref 135–145)

## 2020-02-29 NOTE — TOC Progression Note (Signed)
Transition of Care [facility]) - Progression Note    Patient Details  Name: SAHAANA TRITTEN MRN: [id number] Date of Birth: Jul 26, 1929  Transition of Care [facility]) CM/SW Contact  Tarhonda Hollenberg, Juliann Pulse, RN Phone Number: 02/29/2020, 3:21 PM  Clinical Narrative: [place] [address] [place], [place] [address] [phone]     1.2 mi [place] at [place], [place] [address] [phone] Overall rating Much below average 2. 1.4 mi [place] at the [place], [place] [address] [phone] Overall rating Below average 3. 2 mi [facility] [place], [place] [address] [phone] Overall rating Much above average 4. 2.3 mi [place], [place] [address] [phone] Overall rating Average 5. 2.6 [place] [address] [place], [place] [address] [phone] Overall rating Below average 6. 3.1 mi [place] at [place], [place] [address] [phone] Overall rating Below average 7. 3.2 mi [place] A [facility]ic and [place], [place] [address] [phone] Overall rating Much above average 8. 3.6 mi [place] [address] [place], [place] [address] [phone] Overall rating Much below average 9. 3.7 mi [facility] [place], [place] [address] [phone] Overall rating Below average 10. 5.4 [place], [place] [address] [phone] Overall rating Much below average 11. 5.6 mi [facility] at [place], [place] [address] [phone] Overall rating Much above  average 12. 6.1 mi [facility] [address] [place], [place] [address] [phone] Overall rating Much above average 13. 7.1 [place] and [place], [place] [address] [phone] Overall rating Much below average 14. 7.2 mi [facility] [place] [address] [place], [place] [address] [phone] Overall rating Average 15. 7.9 [facility] [place], [place] [address] [phone] Overall rating Above average  16. 10.6 mi The [place] [address] [place], [place] [address] [phone] Overall rating Below average 17. [place] [address] [place], [place] [address] [phone] Overall rating Much above average 18. 12.5 mi [place] at [facility] [address] [place], [place] [phone] Overall rating Much above average 19. 14.3 mi [facility] [address] [place], [place] [address] [phone] Overall rating Much below average 20. 14.5 [facility] [address] [place], [place] [address] [phone] Overall rating Much below average 21. 14.8 mi [address] [place], [place] [address] [phone] Overall rating Much below average 22. [place], [place] [address] [phone] Overall rating Much above average 23. 15.1 [place] [address] [place], [place] [address] [phone] Overall rating Much below average 24. 15.2 mi [facility] [place],  [address] [phone] Overall rating Much above average 25. 15.6 mi The [place] [address] [place],  [id number] [phone] Overall rating Much below  average 26. 16.1 mi [employer] at [place],  [place] [address] [phone] Overall rating Below average 27. 16.2 mi [facility]r [place], [place] [address] [phone] Overall rating Below average 28. 16.2 mi [place], [place] [address] [phone] Overall rating Below average 29. 17 mi [place] [address] [place] [address] [place], [place] [address] [phone] Overall rating Below average 30. 18.4 mi [address] at [employer] at [facility], [place] [address] [phone] Overall rating Much above average 1. 1.2 mi [facility] [place], [place] [address] [phone] Overall rating Much below average 2. 1.4 mi [place] at the [place], [place] [address] [phone] Overall rating Below average 3. 2 mi [facility] [place], [place] [address] [phone] Overall rating Much above average 4. 2.3 mi [place], [place] [address] [phone] Overall rating Average 5. 2.6 [place] [address] [place], [place] [address] [phone] Overall rating Below average 6. 3.1 mi [place] at [place], [place] [address] [phone] Overall rating Below average 7. 3.2 mi [place] A Masonic and [place], [place] [address] [phone] Overall rating Much above average 8. 3.6 mi [place] [address] [place], [place] [address] [phone] Overall rating Much below average 9. 3.7 mi [facility] [place], [place] [address] [phone] Overall rating Below average 10. 5.4 [place], [place] [address] [phone] Overall rating Much below average 11. 5.6 mi [facility] at [place], [place] [address] [phone] Overall rating Much above average 12. 6.1 mi [facility] [address] [place], [place] [address] [phone] Overall rating Much above average 13. 7.1 [place] and [place], [place] [address] [phone] Overall rating Much below average 14. 7.2 mi [facility] [address] [place], [place] [address] [phone] Overall rating Average 15. 7.9 [place], [place] [address] [phone] Overall rating Above average   Expected Discharge Plan: [place] Barriers to Discharge: Continued Medical Work up  Expected Discharge Plan and Services Expected Discharge Plan: [place]   Discharge Planning Services: CM Consult   Living arrangements for the past 2 months: Single Family Home                                       Social Determinants of Health (SDOH) Interventions    Readmission Risk Interventions No flowsheet data found.

## 2020-02-29 NOTE — TOC Progression Note (Signed)
Transition of Care [facility]) - Progression Note    Patient Details  Name: Connie Baker MRN: [id number] Date of Birth: May 19, 1929  Transition of Care [facility]) CM/SW Contact  Mieke Brinley, Juliann Pulse, RN Phone Number: 02/29/2020, 2:06 PM  Clinical Narrative: patient faxed out to SNF, [facility]-they are not managing patient-the SNF will need to get auth. Await bed offers.      Expected Discharge Plan: [place] Barriers to Discharge: Continued Medical Work up  Expected Discharge Plan and Services Expected Discharge Plan: [place]   Discharge Planning Services: CM Consult   Living arrangements for the past 2 months: Single Family Home                                       Social Determinants of Health (SDOH) Interventions    Readmission Risk Interventions No flowsheet data found.

## 2020-02-29 NOTE — Progress Notes (Signed)
Physical Therapy Treatment Patient Details Name: HAYDYN MACKINS MRN: [id number] DOB: [birth year] Today's Date: 02/29/2020    History of Present Illness Patient is a 86 year old female with history of paroxysmal A. fib, hyperlipidemia, hypothyroidism, stroke, GI bleed. who was admitted 02/24/20 with anemia, hypoxia, dizziness.    PT Comments    Pt pleasant and agreeable to mobilize.  Pt only tolerated short distance ambulating due to fatigue and SOB.  Continue to recommend SNF unless pt has 24/7 assist at home.  Follow Up Recommendations  SNF;Supervision/Assistance - 24 hour     Equipment Recommendations  None recommended by PT    Recommendations for Other Services       Precautions / Restrictions Precautions Precautions: Fall Restrictions Weight Bearing Restrictions: No    Mobility  Bed Mobility Overal bed mobility: Needs Assistance Bed Mobility: Supine to Sit     Supine to sit: Min assist;HOB elevated     General bed mobility comments: slight assist for trunk upright, increased time  Transfers Overall transfer level: Needs assistance Equipment used: Rolling walker (2 wheeled) Transfers: Sit to/from Stand Sit to Stand: Min assist         General transfer comment: assist to rise and steady, verbal cues for safe technqiue  Ambulation/Gait Ambulation/Gait assistance: Min guard Gait Distance (Feet): 38 Feet Assistive device: Rolling walker (2 wheeled) Gait Pattern/deviations: Step-through pattern;Decreased stride length;Trunk flexed     General Gait Details: verbal cues for RW positioning and posture, distance to tolerance, pt with 3/4 dyspnea and fatigued quickly, required one standing rest break; HR 116 bpm during ambulation   Stairs             Wheelchair Mobility    Modified Rankin (Stroke Patients Only)       Balance                                            Cognition Arousal/Alertness: Awake/alert Behavior During  Therapy: WFL for tasks assessed/performed Overall Cognitive Status: No family/caregiver present to determine baseline cognitive functioning                         Following Commands: Follows one step commands consistently     Problem Solving: Difficulty sequencing;Requires verbal cues General Comments: pleasant and following simple commands      Exercises      General Comments        Pertinent Vitals/Pain Pain Assessment: No/denies pain Pain Intervention(s): Repositioned    Home Living                      Prior Function            PT Goals (current goals can now be found in the care plan section) Progress towards PT goals: Progressing toward goals    Frequency    Min 2X/week      PT Plan Current plan remains appropriate    Co-evaluation              AM-PAC PT "6 Clicks" Mobility   Outcome Measure  Help needed turning from your back to your side while in a flat bed without using bedrails?: A Little Help needed moving from lying on your back to sitting on the side of a flat bed without using bedrails?: A Little Help needed moving to and from a  bed to a chair (including a wheelchair)?: A Little Help needed standing up from a chair using your arms (e.g., wheelchair or bedside chair)?: A Little Help needed to walk in hospital room?: A Little Help needed climbing 3-5 steps with a railing? : A Lot 6 Click Score: 17    End of Session Equipment Utilized During Treatment: Gait belt Activity Tolerance: Patient tolerated treatment well Patient left: in chair;with call bell/phone within reach;with chair alarm set;with nursing/sitter in room Nurse Communication: Mobility status PT Visit Diagnosis: Unsteadiness on feet (R26.81);Difficulty in walking, not elsewhere classified (R26.2)     Time: [id number] PT Time Calculation (min) (ACUTE ONLY): 13 min  Charges:  $Gait Training: 8-22 mins                    Arlyce Dice, DPT Acute Rehabilitation  Services Office: [phone]   [place] Ram E 02/29/2020, 11:46 AM

## 2020-02-29 NOTE — TOC Progression Note (Signed)
Transition of Care [facility]) - Progression Note    Patient Details  Name: VENISSA JASA MRN: [id number] Date of Birth: 12-09-1929  Transition of Care [facility]) CM/SW Contact  Sissy Goetzke, Juliann Pulse, RN Phone Number: 02/29/2020, 7:20 PM  Clinical Narrative:  Bed offers given to Dean(son) await choice.     Expected Discharge Plan: [place] [place] Barriers to Discharge: Continued Medical Work up  Expected Discharge Plan and Services Expected Discharge Plan: [place]   Discharge Planning Services: CM Consult   Living arrangements for the past 2 months: Single Family Home                                       Social Determinants of Health (SDOH) Interventions    Readmission Risk Interventions No flowsheet data found.

## 2020-02-29 NOTE — TOC Initial Note (Signed)
Transition of Care [facility]) - Initial/Assessment Note    Patient Details  Name: Connie Baker MRN: [id number] Date of Birth: [birth year]  Transition of Care [facility]) CM/SW Contact:    Connie Phi, RN Phone Number: 02/29/2020, 12:48 PM  Clinical Narrative:Spoke to patient/son in rm about d/c plans-they both agree to SNF-they know about [facility] but are open to all SNF choices. PT recc SNF;await OT-ordered,await cons & recc prior to fl2 being done, will need covid,& auth.                  Expected Discharge Plan: Skilled Nursing Facility Barriers to Discharge: Continued Medical Work up   Patient Goals and CMS Choice Patient states their goals for this hospitalization and ongoing recovery are:: go to rehab      Expected Discharge Plan and Services Expected Discharge Plan: [place]   Discharge Planning Services: CM Consult   Living arrangements for the past 2 months: Single Family Home                                      Prior Living Arrangements/Services Living arrangements for the past 2 months: Single Family Home Lives with:: Adult Children Patient language and need for interpreter reviewed:: Yes Do you feel safe going back to the place where you live?: Yes      Need for Family Participation in Patient Care: No (Comment) Care giver support system in place?: Yes (comment)   Criminal Activity/Legal Involvement Pertinent to Current Situation/Hospitalization: No - Comment as needed  Activities of Daily Living Home Assistive Devices/Equipment: Walker (specify type) ADL Screening (condition at time of admission) Patient's cognitive ability adequate to safely complete daily activities?: No Is the patient deaf or have difficulty hearing?: No Does the patient have difficulty seeing, even when wearing glasses/contacts?: No Does the patient have difficulty concentrating, remembering, or making decisions?: Yes Patient able to express need for  assistance with ADLs?: No Does the patient have difficulty dressing or bathing?: Yes Independently performs ADLs?: No Communication: Independent Dressing (OT): Needs assistance Grooming: Needs assistance Feeding: Independent Bathing: Needs assistance Toileting: Needs assistance In/Out Bed: Needs assistance Does the patient have difficulty walking or climbing stairs?: Yes Weakness of Legs: Both Weakness of Arms/Hands: Both  Permission Sought/Granted Permission sought to share information with : Case Manager Permission granted to share information with : Yes, Verbal Permission Granted  Share Information with NAME: Case Manager     Permission granted to share info w Relationship: Connie Baker [id number]     Emotional Assessment Appearance:: Appears stated age Attitude/Demeanor/Rapport: Gracious Affect (typically observed): Accepting Orientation: : Oriented to Self, Oriented to  Time, Oriented to Situation Alcohol / Substance Use: Not Applicable Psych Involvement: No (comment)  Admission diagnosis:  Anemia [D64.9] Patient Active Problem List   Diagnosis Date Noted  . Heme positive stool   . Angiodysplasia of colon with hemorrhage   . Benign neoplasm of sigmoid colon   . Chronic anticoagulation   . GI bleed 02/25/2020  . Acute systolic CHF (congestive heart failure) ([place]) 02/25/2020  . AF (paroxysmal atrial fibrillation) ([place]) 02/25/2020  . Hypothyroidism 02/25/2020  . Hyperlipidemia 02/25/2020  . Acute respiratory failure with hypoxia ([place]) 02/25/2020  . Dementia ([place]) 02/25/2020  . Anemia 02/24/2020  . Post herpetic neuralgia 12/22/2017  . Embolus to lower extremity ([place]) 06/26/2011   PCP:  Connie Hashimoto., MD Pharmacy:  [place], [place] [id number] [place] [id number] Phone: [phone] Fax: [phone]  [place] [address], [place] [id number] [place] mail [place] [facility] [place] [address] Phone: [phone] Fax: [phone]     Social Determinants of Health (SDOH) Interventions    Readmission Risk Interventions No flowsheet data found.

## 2020-02-29 NOTE — NC FL2 (Signed)
[place] LEVEL OF CARE SCREENING TOOL     IDENTIFICATION  Patient Name: Connie Baker Birthdate: 04/13/1929 Sex: female Admission Date (Current Location): 02/24/2020  [facility] and [place] Number:  Advertising copywriter and Address:  [facility],  [place], [place]      Provider Number: [id number]  Attending Physician Name and Address:  Alma Friendly, MD  Relative Name and Phone Number:  [name] [phone]    Current Level of Care: Hospital Recommended Level of Care: [place] Prior Approval Number:    Date Approved/Denied:   PASRR Number: [id number]  Discharge Plan: SNF    Current Diagnoses: Patient Active Problem List   Diagnosis Date Noted  . Heme positive stool   . Angiodysplasia of colon with hemorrhage   . Benign neoplasm of sigmoid colon   . Chronic anticoagulation   . GI bleed 02/25/2020  . Acute systolic CHF (congestive heart failure) ([place]) 02/25/2020  . AF (paroxysmal atrial fibrillation) ([place]) 02/25/2020  . Hypothyroidism 02/25/2020  . Hyperlipidemia 02/25/2020  . Acute respiratory failure with hypoxia ([place]) 02/25/2020  . Dementia ([place]) 02/25/2020  . Anemia 02/24/2020  . Post herpetic neuralgia 12/22/2017  . Embolus to lower extremity ([place]) 06/26/2011    Orientation RESPIRATION BLADDER Height & Weight     Self, Time, Situation  Normal Incontinent, External catheter Weight: 110 kg Height:  5\' 2"  (157.5 cm)  BEHAVIORAL SYMPTOMS/MOOD NEUROLOGICAL BOWEL NUTRITION STATUS      Incontinent Diet(Regular)  AMBULATORY STATUS COMMUNICATION OF NEEDS Skin   Limited Assist Verbally Normal                       Personal Care Assistance Level of Assistance  Bathing, Feeding, Dressing Bathing Assistance: Limited assistance Feeding assistance: Limited assistance Dressing Assistance: Limited assistance     Functional Limitations Info  Sight(eyeglasses) Sight Info:  Impaired Hearing Info: Adequate Speech Info: Adequate    SPECIAL CARE FACTORS FREQUENCY  PT (By licensed PT), OT (By licensed OT)(May or may not need OT)     PT Frequency: 5x week              Contractures Contractures Info: Not present    Additional Factors Info  Code Status, Allergies Code Status Info: full code Allergies Info: Sulfa antibiotics           Current Medications (02/29/2020):  This is the current hospital active medication list Current Facility-Administered Medications  Medication Dose Route Frequency Provider Last Rate Last Admin  . acetaminophen (TYLENOL) tablet 650 mg  650 mg Oral Q6H PRN Rai, Ripudeep K, MD   650 mg at 02/26/20 2151   Or  . acetaminophen (TYLENOL) suppository 650 mg  650 mg Rectal Q6H PRN Rai, Ripudeep K, MD      . levothyroxine (SYNTHROID) tablet 25 mcg  25 mcg Oral Q0600 Rai, Ripudeep K, MD   25 mcg at 02/29/20 0503  . memantine (NAMENDA) tablet 10 mg  10 mg Oral Daily Rai, Ripudeep K, MD   10 mg at 02/29/20 0946  . metoprolol tartrate (LOPRESSOR) tablet 25 mg  25 mg Oral BID Rai, Ripudeep K, MD   25 mg at 02/29/20 0946  . ondansetron (ZOFRAN) tablet 4 mg  4 mg Oral Q6H PRN Rai, Ripudeep K, MD   4 mg at 02/26/20 1224   Or  . ondansetron (ZOFRAN) injection 4 mg  4 mg Intravenous Q6H PRN Rai, Ripudeep  K, MD      . pravastatin (PRAVACHOL) tablet 20 mg  20 mg Oral Daily Rai, Ripudeep K, MD   20 mg at 02/29/20 [id number]     Discharge Medications: Please see discharge summary for a list of discharge medications.  Relevant Imaging Results:  Relevant Lab Results:   Additional Information ss#243 [address], Juliann Pulse, [place]

## 2020-02-29 NOTE — Care Management Important Message (Signed)
Important Message  Patient Details IM Letter given to Dessa Phi RN Case Manager to present to the Patient Name: Connie Baker MRN: [id number] Date of Birth: 1929-06-28   Medicare Important Message Given:  Yes     Kerin Salen 02/29/2020, 11:23 AM

## 2020-02-29 NOTE — Progress Notes (Signed)
PROGRESS NOTE  Connie Baker URK:[id number] DOB: [birth year] DOA: 02/24/2020 PCP: Helen Hashimoto., MD   HPI/Recap of past 24 hours Patient is a 86 year old female with history of paroxysmal A. fib, hyperlipidemia, hypothyroidism who was admitted to [facility] on 11/13 with weakness, hypoxemia, dizziness for 1 week.  Patient was found to have acute anemia. Baseline hemoglobin is 13, hemoglobin at the facility was 9.0 with heme positive stool. On the day of transfer to [facility] hemoglobin dropped to 8.1 and was given 1 unit packed RBC transfusion.  Patient was transferred for GI evaluation to [facility]. Patient is on Xarelto which was stopped on admission at [facility]. Patient presented with unexplained hypoxemia, 2D echo was obtained which showed mild systolic CHF with EF 40 to 45%, moderate to severe MR.  In 2017, patient had a EF of 65% on Lexiscan stress test.  Patient had normal chest x-ray and normal BNP.     Today, patient's reports feeling weak overall, denies any new complaints.  No further bleeding episode noted.    Assessment/Plan: Principal Problem:   Anemia Active Problems:   GI bleed   Acute systolic CHF (congestive heart failure) (HCC)   AF (paroxysmal atrial fibrillation) (HCC)   Hypothyroidism   Hyperlipidemia   Acute respiratory failure with hypoxia (HCC)   Dementia (HCC)   Chronic anticoagulation   Heme positive stool   Angiodysplasia of colon with hemorrhage   Benign neoplasm of sigmoid colon   Symptomatic anemia with GI bleed, acute on chronic blood loss anemia Baseline hemoglobin~13, presented with hemoglobin of 9.6-->8.1 FOBT positive S/p 1 unit of PRBC prior to transfer On 3/14 p.m. patient started having active rectal bleeding, painless GI on board, status post EGD/colonoscopy on 02/27/2020 which showed a single bleeding cecal angiectasia, treated successfully with APC, 114 mm polyp in the proximal sigmoid colon removed with a hot snare, moderate diverticulosis.   EGD unremarkable GI had extensive discussion with daughter, agreed to discontinue home Xarelto and switch to low-dose aspirin.  Increased risk of stroke explained to daughter, who verbalized understanding.  Patient may begin aspirin 5 to 7 days post EGD Daily CBC  ?Chronic systolic CHF 2D echo done during the admission at [facility] showed EF of 45 to 50%, apically akinesis, moderate to severe mitral regurgitation Recommend outpatient follow-up with her cardiologist, currently no anginal symptoms, no acute shortness of breath, chest x-ray at [facility] showed no pulmonary edema Hold Lasix for now (was placed on 20 mg p.o. daily), BP somewhat soft  Paroxysmal AF with RVR HR better controlled Continue metoprolol 25 mg p.o. twice daily (home dose 50 mg PO BID), titrate as tolerated with BP Xarelto currently discontinued Continue telemetry  History of DVT Xarelto currently discontinued  Hypothyroidism TSH WNL Continue Synthroid  Hyperlipidemia Continue Pravachol  Dementia Currently stable, son does not believe patient has dementia, per son "patient has memory issues and word finding troubles at baseline", patient appears at baseline currently  Morbid obesity Lifestyle modification advised      DVT Prophylaxis: SCDs  Code Status: Full  Family Communication: GI spoke to daughter at bedside on 02/28/2020  Disposition Plan:    Patient came from: home by herself, walks with a walker, son lives next door  Anticipated d/c place: SNF, awaiting bed placement  Barriers to d/c OR conditions which need to be met to effect a safe d/c: Awaiting bed placement   Consultants:  GI  Procedures:  EGD and colonoscopy on March 16  Antibiotics:  None   Objective: BP 113/71 (BP Location: Left Arm)   Pulse 78   Temp 98.5 F (36.9 C) (Oral)   Resp 18   Ht '5\' 2"'  (1.575 m)   Wt 110 kg   SpO2  97%   BMI 44.35 kg/m   Intake/Output Summary (Last 24 hours) at 02/29/2020 1612 Last data filed at 02/29/2020 0847 Gross per 24 hour  Intake 120 ml  Output 100 ml  Net 20 ml   Filed Weights   02/26/20 1449 02/27/20 1104  Weight: 110 kg 110 kg    Exam:  General: NAD   Cardiovascular: S1, S2 present  Respiratory: CTAB  Abdomen: Soft, nontender, nondistended, bowel sounds present  Musculoskeletal: No bilateral pedal edema noted  Skin: Normal  Psychiatry: Normal mood   Data Reviewed: Basic Metabolic Panel: Recent Labs  Lab 02/25/20 0556 02/26/20 0009 02/27/20 0740 02/28/20 0152 02/29/20 0526  NA 144 146* 149* 143 141  K 4.2 4.1 4.2 3.7 3.8  CL 109 109 116* 114* 112*  CO2 '26 27 24 23 23  ' GLUCOSE 98 114* 114* 99 98  BUN 24* '20 20 20 23  ' CREATININE 0.85 0.88 1.00 0.76 0.73  CALCIUM 8.1* 8.2* 8.6* 8.2* 7.8*   Liver Function Tests: No results for input(s): AST, ALT, ALKPHOS, BILITOT, PROT, ALBUMIN in the last 168 hours. No results for input(s): LIPASE, AMYLASE in the last 168 hours. No results for input(s): AMMONIA in the last 168 hours. CBC: Recent Labs  Lab 02/25/20 0556 02/25/20 1915 02/26/20 0009 02/26/20 0539 02/27/20 0027 02/27/20 0740 02/28/20 0152 02/28/20 1539 02/29/20 0526  WBC 6.5  --  6.1  --   --  5.3 5.2  --  4.6  NEUTROABS  --   --   --   --   --   --   --   --  2.2  HGB 8.8*   < > 9.4*   < > 8.9* 8.9* 7.7* 8.6* 7.9*  HCT 28.6*   < > 29.2*   < > 28.3* 28.6* 25.6* 27.9* 24.6*  MCV 104.8*  --  102.5*  --   --  106.7* 107.6*  --  102.9*  PLT 228  --  251  --   --  244 212  --  216   < > = values in this interval not displayed.   Cardiac Enzymes:   No results for input(s): CKTOTAL, CKMB, CKMBINDEX, TROPONINI in the last 168 hours. BNP (last 3 results) No results for input(s): BNP in the last 8760 hours.  ProBNP (last 3 results) No results for input(s): PROBNP in the last 8760 hours.  CBG: No results for input(s): GLUCAP in the last  168 hours.  Recent Results (from the past 240 hour(s))  MRSA PCR Screening     Status: None   Collection Time: 02/25/20  9:08 PM   Specimen: Nasal Mucosa; Nasopharyngeal  Result Value Ref Range Status   MRSA by PCR NEGATIVE NEGATIVE Final    Comment:        The GeneXpert MRSA Assay (FDA approved for NASAL specimens only), is one component of a comprehensive MRSA colonization surveillance program. It is not intended to diagnose MRSA infection nor to guide or monitor treatment for  MRSA infections. Performed at [facility], [place] [address], [place], [place] [address]      Studies: No results found.  Scheduled Meds: . levothyroxine  25 mcg Oral Q0600  . memantine  10 mg Oral Daily  . metoprolol tartrate  25 mg Oral BID  . pravastatin  20 mg Oral Daily    Continuous Infusions:     Alma Friendly MD  [facility]   02/29/2020, 4:12 PM  LOS: 5 days

## 2020-02-29 NOTE — Progress Notes (Signed)
    Progress Note   Subjective  Chief Complaint: GI Bleed, Weakness and dizziness x1 week, s/p EGD and colonoscopy 02/27/20 with APC of cecal AVM  Today, patient tells me that she feels better than when she came into the hospital but "today is just not a good day".  Denies any abdominal pain or further bleeding.    Objective   Vital signs in last 24 hours: Temp:  [98.1 F (36.7 C)-99.1 F (37.3 C)] 98.2 F (36.8 C) (03/18 0839) Pulse Rate:  [60-104] 60 (03/18 0839) Resp:  [18-20] 20 (03/18 0839) BP: (103-132)/(47-70) 118/70 (03/18 0839) SpO2:  [95 %-99 %] 96 % (03/18 0839) Last BM Date: 02/27/20 General:   Elderly white female in NAD Heart:  Regular rate and rhythm; no murmurs Lungs: Respirations even and unlabored, lungs CTA bilaterally Abdomen:  Soft, moderate epigastric TTP and nondistended. Normal bowel sounds. Extremities:  Without edema. Neurologic:  Alert and oriented,  grossly normal neurologically. Psych:  Cooperative. Normal mood and affect.  Intake/Output from previous day: 03/17 0701 - 03/18 0700 In: 500 [P.O.:500] Out: 100 [Urine:100] Intake/Output this shift: Total I/O In: 120 [P.O.:120] Out: -   Lab Results: Recent Labs    02/27/20 0740 02/27/20 0740 02/28/20 0152 02/28/20 1539 02/29/20 0526  WBC 5.3  --  5.2  --  4.6  HGB 8.9*   < > 7.7* 8.6* 7.9*  HCT 28.6*   < > 25.6* 27.9* 24.6*  PLT 244  --  212  --  216   < > = values in this interval not displayed.   BMET Recent Labs    02/27/20 0740 02/28/20 0152 02/29/20 0526  NA 149* 143 141  K 4.2 3.7 3.8  CL 116* 114* 112*  CO2 24 23 23   GLUCOSE 114* 99 98  BUN 20 20 23   CREATININE 1.00 0.76 0.73  CALCIUM 8.6* 8.2* 7.8*     Assessment / Plan:   Assessment: 1.  GI bleed: 2 units PRBCs at [facility] another 2 units since admission, Xarelto now discontinued, no further overt bleeding over the past 48 hours since colonoscopy with finding of cecal AVM and APC on 02/27/2020, hemoglobin now stable  8.9--> 7.7--> 7.9 2.  Weakness/dizziness 3.  A. fib: Was on Xarelto, this is being discontinued and patient will be started on aspirin 4 days 4.  Dementia  Plan: 1.  Continue to monitor hemoglobin with transfusion as lasted 7 during this admission 2.  Continue regular diet 3.  As per Dr. Vena Rua discussion with the family plans are to stop Xarelto indefinitely and start Aspirin in 4 days instead 4.  Again further discussion is being had with the family and social work to discuss possible assisted living facility at discharge 5.  Patient will need repeat CBC in a week to ensure that hemoglobin is stable, this can be done by her PCP. 6.  We will likely sign off today.  Please await any final recommendations from Dr. Hilarie Fredrickson later today.  Thank you for your kind consultation.   LOS: 5 days   Connie Baker  02/29/2020, 9:34 AM

## 2020-03-01 LAB — CBC WITH DIFFERENTIAL/PLATELET
Abs Immature Granulocytes: 0.02 10*3/uL (ref 0.00–0.07)
Basophils Absolute: 0 10*3/uL (ref 0.0–0.1)
Basophils Relative: 0 %
Eosinophils Absolute: 0.2 10*3/uL (ref 0.0–0.5)
Eosinophils Relative: 4 %
HCT: 25 % — ABNORMAL LOW (ref 36.0–46.0)
Hemoglobin: 7.9 g/dL — ABNORMAL LOW (ref 12.0–15.0)
Immature Granulocytes: 0 %
Lymphocytes Relative: 26 %
Lymphs Abs: 1.3 10*3/uL (ref 0.7–4.0)
MCH: 32.6 pg (ref 26.0–34.0)
MCHC: 31.6 g/dL (ref 30.0–36.0)
MCV: 103.3 fL — ABNORMAL HIGH (ref 80.0–100.0)
Monocytes Absolute: 0.6 10*3/uL (ref 0.1–1.0)
Monocytes Relative: 12 %
Neutro Abs: 2.9 10*3/uL (ref 1.7–7.7)
Neutrophils Relative %: 58 %
Platelets: 209 10*3/uL (ref 150–400)
RBC: 2.42 MIL/uL — ABNORMAL LOW (ref 3.87–5.11)
RDW: 14.2 % (ref 11.5–15.5)
WBC: 4.9 10*3/uL (ref 4.0–10.5)
nRBC: 0 % (ref 0.0–0.2)

## 2020-03-01 LAB — BASIC METABOLIC PANEL
Anion gap: 7 (ref 5–15)
BUN: 21 mg/dL (ref 8–23)
CO2: 24 mmol/L (ref 22–32)
Calcium: 8.1 mg/dL — ABNORMAL LOW (ref 8.9–10.3)
Chloride: 112 mmol/L — ABNORMAL HIGH (ref 98–111)
Creatinine, Ser: 0.76 mg/dL (ref 0.44–1.00)
GFR calc Af Amer: >60 mL/min (ref >60)
GFR calc non Af Amer: >60 mL/min (ref >60)
Glucose, Bld: 101 mg/dL — ABNORMAL HIGH (ref 70–99)
Potassium: 3.8 mmol/L (ref 3.5–5.1)
Sodium: 143 mmol/L (ref 135–145)

## 2020-03-01 LAB — SARS CORONAVIRUS 2 (TAT 6-24 HRS): SARS Coronavirus 2: NEGATIVE

## 2020-03-01 MED ORDER — FUROSEMIDE 20 MG PO TABS
20.0000 mg | ORAL_TABLET | Freq: Every day | ORAL | Status: DC
  Administered 2020-03-01 – 2020-03-05 (×5): 20 mg via ORAL
  Filled 2020-03-01 (×4): qty 1

## 2020-03-01 NOTE — TOC Transition Note (Signed)
Transition of Care [facility]) - CM/SW Discharge Note   Patient Details  Name: Connie Baker MRN: [id number] Date of Birth: Jul 30, 1929  Transition of Care [facility]) CM/SW Contact:  Lynnell Catalan, RN Phone Number: 03/01/2020, 12:46 PM   Clinical Narrative:    This CM followed up with pt son Marlou Sa for SNF bed choice. [facility] chosen. They have started insurance auth at the SNF. Will need new covid test on Sunday 3/21 for dc to SNF Monday or Tuesday when auth received.   Final next level of care: Skilled Nursing Facility Barriers to Discharge: Continued Medical Work up   Patient Goals and CMS Choice Patient states their goals for this hospitalization and ongoing recovery are:: go to rehab      Discharge Placement                       Discharge Plan and Services   Discharge Planning Services: CM Consult                                 Social Determinants of Health (SDOH) Interventions     Readmission Risk Interventions No flowsheet data found.

## 2020-03-01 NOTE — Progress Notes (Signed)
Physical Therapy Treatment Patient Details Name: Connie Baker MRN: [id number] DOB: [birth year] Today's Date: 03/01/2020    History of Present Illness Patient is a 86 year old female with history of paroxysmal A. fib, hyperlipidemia, hypothyroidism, stroke, GI bleed. who was admitted 02/24/20 with anemia, hypoxia, dizziness.    PT Comments    Pt up in recliner on arrival.  Pt assisted with ambulating in hallway and only able to tolerate limited distance due to dyspnea and also R flank pain today (pt report chronic pain from having shingles years ago).  Pt fatigues quickly and would benefit from 24/7 assist upon d/c.    Follow Up Recommendations  SNF;Supervision/Assistance - 24 hour     Equipment Recommendations  None recommended by PT    Recommendations for Other Services       Precautions / Restrictions Precautions Precautions: Fall    Mobility  Bed Mobility               General bed mobility comments: pt up in recliner  Transfers Overall transfer level: Needs assistance Equipment used: Rolling walker (2 wheeled) Transfers: Sit to/from Stand Sit to Stand: Min assist         General transfer comment: slight assist to rise and steady, cues for hand placement  Ambulation/Gait Ambulation/Gait assistance: Min guard Gait Distance (Feet): 40 Feet Assistive device: Rolling walker (2 wheeled) Gait Pattern/deviations: Step-through pattern;Decreased stride length;Trunk flexed     General Gait Details: verbal cues for RW positioning and posture, distance to tolerance, pt presents with 3/4 dyspnea and fatigued quickly, encouraged rest breaks as needed; HR 128 bpm during ambulation, SpO2 96% on room air   Stairs             Wheelchair Mobility    Modified Rankin (Stroke Patients Only)       Balance                                            Cognition Arousal/Alertness: Awake/alert Behavior During Therapy: WFL for tasks  assessed/performed Overall Cognitive Status: No family/caregiver present to determine baseline cognitive functioning                         Following Commands: Follows one step commands consistently     Problem Solving: Difficulty sequencing;Requires verbal cues General Comments: pleasant and following simple commands      Exercises      General Comments        Pertinent Vitals/Pain Pain Assessment: Faces Faces Pain Scale: Hurts even more Pain Location: R flank - reports from shingles years ago (observed skin intact) Pain Descriptors / Indicators: Grimacing;Aching Pain Intervention(s): Monitored during session;Repositioned;RN gave pain meds during session    Home Living                      Prior Function            PT Goals (current goals can now be found in the care plan section) Progress towards PT goals: Progressing toward goals    Frequency    Min 2X/week      PT Plan Current plan remains appropriate    Co-evaluation              AM-PAC PT "6 Clicks" Mobility   Outcome Measure  Help needed turning from your back to your  side while in a flat bed without using bedrails?: A Little Help needed moving from lying on your back to sitting on the side of a flat bed without using bedrails?: A Little Help needed moving to and from a bed to a chair (including a wheelchair)?: A Little Help needed standing up from a chair using your arms (e.g., wheelchair or bedside chair)?: A Little Help needed to walk in hospital room?: A Little Help needed climbing 3-5 steps with a railing? : A Lot 6 Click Score: 17    End of Session Equipment Utilized During Treatment: Gait belt Activity Tolerance: Patient limited by fatigue;Patient tolerated treatment well Patient left: in chair;with call bell/phone within reach;with chair alarm set Nurse Communication: Mobility status PT Visit Diagnosis: Unsteadiness on feet (R26.81);Difficulty in walking, not  elsewhere classified (R26.2)     Time: [id number] PT Time Calculation (min) (ACUTE ONLY): 17 min  Charges:  $Gait Training: 8-22 mins                     Arlyce Dice, DPT Acute Rehabilitation Services Office: [phone]  York Ram E 03/01/2020, 4:04 PM

## 2020-03-01 NOTE — Evaluation (Signed)
Occupational Therapy Evaluation Patient Details Name: Connie Baker MRN: [id number] DOB: 1929-10-30 Today's Date: 03/01/2020    History of Present Illness Patient is a 86 year old female with history of paroxysmal A. fib, hyperlipidemia, hypothyroidism, stroke, GI bleed. who was admitted 02/24/20 with anemia, hypoxia, dizziness.   Clinical Impression   Patient is an 86 year old female that typically lives alone, unable to provide history at this time due to confusion per chart patient's son lives next door. Currently patient with limited activity tolerance, decreased balance, safety awareness and pain with mobility. Patient min A for functional transfer to recliner with max cues for posture (leaning heavily over walker) and for hand placement. Recommend continued acute OT services to maximize patient safety and independence with self care.    Follow Up Recommendations  SNF;Supervision/Assistance - 24 hour    Equipment Recommendations  Other (comment)(defer to next venue)       Precautions / Restrictions Precautions Precautions: Fall Restrictions Weight Bearing Restrictions: No      Mobility Bed Mobility Overal bed mobility: Needs Assistance Bed Mobility: Supine to Sit     Supine to sit: HOB elevated;Min assist;Mod assist     General bed mobility comments: assist for trunk elevation, increased time  Transfers Overall transfer level: Needs assistance Equipment used: Rolling walker (2 wheeled) Transfers: Sit to/from Stand Sit to Stand: Min assist         General transfer comment: assist to power up to standing, verbal cues for safe body mechanics     Balance Overall balance assessment: Needs assistance Sitting-balance support: Feet supported;No upper extremity supported Sitting balance-Leahy Scale: Good     Standing balance support: Bilateral upper extremity supported;During functional activity Standing balance-Leahy Scale: Poor Standing balance comment: reliant  on B UE support                           ADL either performed or assessed with clinical judgement   ADL Overall ADL's : Needs assistance/impaired Eating/Feeding: Set up;Sitting   Grooming: Set up;Oral care;Wash/dry face;Sitting Grooming Details (indicate cue type and reason): unable to tolerate standing at sink Upper Body Bathing: Set up;Sitting   Lower Body Bathing: Moderate assistance;Sitting/lateral leans;Sit to/from stand   Upper Body Dressing : Set up;Sitting   Lower Body Dressing: Moderate assistance;Sitting/lateral leans;Sit to/from stand Lower Body Dressing Details (indicate cue type and reason): decreased standing tolerance, increased pain with mobility Toilet Transfer: Minimal assistance;BSC;Ambulation;RW;Cueing for safety Toilet Transfer Details (indicate cue type and reason): poor safety awareness, cues for body mechanics Toileting- Clothing Manipulation and Hygiene: Moderate assistance;Sitting/lateral lean;Sit to/from stand       Functional mobility during ADLs: Minimal assistance;Rolling walker;Cueing for safety;Cueing for sequencing General ADL Comments: patient requires increased assistance for self care due to pain, decreased safety awareness, limited balance     Vision Baseline Vision/History: Wears glasses              Pertinent Vitals/Pain Pain Assessment: Faces Faces Pain Scale: Hurts even more Pain Location: global, cannot specify Pain Descriptors / Indicators: Grimacing;Discomfort;Moaning Pain Intervention(s): Monitored during session;Limited activity within patient's tolerance     Hand Dominance Right   Extremity/Trunk Assessment Upper Extremity Assessment Upper Extremity Assessment: Generalized weakness   Lower Extremity Assessment Lower Extremity Assessment: Defer to PT evaluation   Cervical / Trunk Assessment Cervical / Trunk Assessment: Kyphotic   Communication Communication Communication: Expressive difficulties;Other  (comment)(difficulty with word finding)   Cognition Arousal/Alertness: Awake/alert Behavior During Therapy: [facility] for  tasks assessed/performed Overall Cognitive Status: No family/caregiver present to determine baseline cognitive functioning Area of Impairment: Orientation;Memory;Following commands;Safety/judgement;Awareness;Problem solving                 Orientation Level: Disoriented to;Time;Situation;Place   Memory: Decreased short-term memory Following Commands: Follows one step commands with increased time Safety/Judgement: Decreased awareness of deficits;Decreased awareness of safety Awareness: Intellectual Problem Solving: Difficulty sequencing;Requires verbal cues;Requires tactile cues General Comments: very pleasant and cooperative              Home Living Family/patient expects to be discharged to:: Skilled nursing facility                                 Additional Comments: patient unable to provide PLOF/home set up, per chart patient's son lives next door      Prior Functioning/Environment Level of Independence: (unsure)        Comments: patient cannot provide accurate history, however per chart was home alone so imagine she was mod I for BADLs such as toileting        OT Problem List: Decreased activity tolerance;Impaired balance (sitting and/or standing);Decreased safety awareness;Decreased cognition;Decreased knowledge of use of DME or AE;Pain      OT Treatment/Interventions: Self-care/ADL training;Therapeutic exercise;DME and/or AE instruction;Therapeutic activities;Cognitive remediation/compensation;Patient/family education;Balance training    OT Goals(Current goals can be found in the care plan section) Acute Rehab OT Goals Patient Stated Goal: agreed to getting into chair OT Goal Formulation: With patient Time For Goal Achievement: 03/15/20 Potential to Achieve Goals: Good  OT Frequency: Min 2X/week    AM-PAC OT "6 Clicks" Daily  Activity     Outcome Measure Help from another person eating meals?: None Help from another person taking care of personal grooming?: A Little Help from another person toileting, which includes using toliet, bedpan, or urinal?: A Lot Help from another person bathing (including washing, rinsing, drying)?: A Lot Help from another person to put on and taking off regular upper body clothing?: A Little Help from another person to put on and taking off regular lower body clothing?: A Lot 6 Click Score: 16   End of Session Equipment Utilized During Treatment: Rolling walker Nurse Communication: Patient requests pain meds  Activity Tolerance: Patient limited by pain Patient left: in chair;with call bell/phone within reach;with chair alarm set  OT Visit Diagnosis: Other abnormalities of gait and mobility (R26.89);Unsteadiness on feet (R26.81);Muscle weakness (generalized) (M62.81);Other symptoms and signs involving cognitive function;Pain Pain - part of body: (cannot specify)                Time: [id number] OT Time Calculation (min): 23 min Charges:  OT General Charges $OT Visit: 1 Visit OT Evaluation $OT Eval Moderate Complexity: 1 Mod OT Treatments $Self Care/Home Management : 8-22 mins  Shon Millet OT OT office: [place] 03/01/2020, 12:15 PM

## 2020-03-01 NOTE — Progress Notes (Signed)
Recd pt from off going Therapist, sports. Condition stable. No change in initial am assessment at this time. Cont with plan of care

## 2020-03-01 NOTE — Progress Notes (Signed)
PROGRESS NOTE  Connie Baker FIE:[id number] DOB: [birth year] DOA: 02/24/2020 PCP: Helen Hashimoto., MD   HPI/Recap of past 24 hours Patient is a 86 year old female with history of paroxysmal A. fib, hyperlipidemia, hypothyroidism who was admitted to [facility] on 11/13 with weakness, hypoxemia, dizziness for 1 week.  Patient was found to have acute anemia. Baseline hemoglobin is 13, hemoglobin at the facility was 9.0 with heme positive stool. On the day of transfer to [facility] hemoglobin dropped to 8.1 and was given 1 unit packed RBC transfusion.  Patient was transferred for GI evaluation to [facility]. Patient is on Xarelto which was stopped on admission at [facility]. Patient presented with unexplained hypoxemia, 2D echo was obtained which showed mild systolic CHF with EF 40 to 45%, moderate to severe MR.  In 2017, patient had a EF of 65% on Lexiscan stress test.  Patient had normal chest x-ray and normal BNP.     Today, patient continues to report feeling weak overall. Denies any new complaints.     Assessment/Plan: Principal Problem:   Anemia Active Problems:   GI bleed   Acute systolic CHF (congestive heart failure) (HCC)   AF (paroxysmal atrial fibrillation) (HCC)   Hypothyroidism   Hyperlipidemia   Acute respiratory failure with hypoxia (HCC)   Dementia (HCC)   Chronic anticoagulation   Heme positive stool   Angiodysplasia of colon with hemorrhage   Benign neoplasm of sigmoid colon   Symptomatic anemia with GI bleed, acute on chronic blood loss anemia Baseline hemoglobin~13, presented with hemoglobin of 9.6-->8.1 FOBT positive S/p 1 unit of PRBC prior to transfer On 3/14 p.m. patient started having active rectal bleeding, painless GI on board, status post EGD/colonoscopy on 02/27/2020 which showed a single bleeding cecal angiectasia, treated successfully with APC, 114 mm polyp in the proximal sigmoid colon removed with a hot snare, moderate diverticulosis.  EGD unremarkable GI  had extensive discussion with daughter, agreed to discontinue home Xarelto and switch to low-dose aspirin.  Increased risk of stroke explained to daughter, who verbalized understanding.  Patient may begin aspirin 5 to 7 days post EGD Daily CBC  ?Chronic systolic CHF 2D echo done during the admission at [facility] showed EF of 45 to 50%, apically akinesis, moderate to severe mitral regurgitation Recommend outpatient follow-up with her cardiologist, currently no anginal symptoms, no acute shortness of breath, chest x-ray at [facility] showed no pulmonary edema Restart home dose Lasix  Paroxysmal AF with RVR HR better controlled Continue metoprolol 25 mg p.o. twice daily (home dose 50 mg PO BID), titrate as tolerated with BP Xarelto currently discontinued Continue telemetry  History of DVT Xarelto currently discontinued  Hypothyroidism TSH WNL Continue Synthroid  Hyperlipidemia Continue Pravachol  Dementia Currently stable, son does not believe patient has dementia, per son "patient has memory issues and word finding troubles at baseline", patient appears at baseline currently  Morbid obesity Lifestyle modification advised      DVT Prophylaxis: SCDs  Code Status: Full  Family Communication: GI spoke to daughter at bedside on 02/28/2020  Disposition Plan:    Patient came from: home by herself, walks with a walker, son lives next door  Anticipated d/c place: SNF, awaiting bed placement  Barriers to d/c OR conditions which need to be met to effect a safe d/c: Awaiting bed placement   Consultants:  GI  Procedures:  EGD and colonoscopy on March 16  Antibiotics:  None   Objective: BP 139/73 (BP Location: Left Arm)   Pulse 63   Temp 98.3 F (36.8 C) (Oral)   Resp 15   Ht '5\' 2"'  (1.575 m)   Wt 110 kg   SpO2 98%   BMI 44.35 kg/m   Intake/Output Summary (Last 24 hours) at  03/01/2020 1722 Last data filed at 03/01/2020 0543 Gross per 24 hour  Intake 0 ml  Output 350 ml  Net -350 ml   Filed Weights   02/26/20 1449 02/27/20 1104  Weight: 110 kg 110 kg    Exam:  General: NAD   Cardiovascular: S1, S2 present  Respiratory: CTAB  Abdomen: Soft, nontender, nondistended, bowel sounds present  Musculoskeletal: No bilateral pedal edema noted  Skin: Normal  Psychiatry: Normal mood   Data Reviewed: Basic Metabolic Panel: Recent Labs  Lab 02/26/20 0009 02/27/20 0740 02/28/20 0152 02/29/20 0526 03/01/20 0502  NA 146* 149* 143 141 143  K 4.1 4.2 3.7 3.8 3.8  CL 109 116* 114* 112* 112*  CO2 '27 24 23 23 24  ' GLUCOSE 114* 114* 99 98 101*  BUN '20 20 20 23 21  ' CREATININE 0.88 1.00 0.76 0.73 0.76  CALCIUM 8.2* 8.6* 8.2* 7.8* 8.1*   Liver Function Tests: No results for input(s): AST, ALT, ALKPHOS, BILITOT, PROT, ALBUMIN in the last 168 hours. No results for input(s): LIPASE, AMYLASE in the last 168 hours. No results for input(s): AMMONIA in the last 168 hours. CBC: Recent Labs  Lab 02/26/20 0009 02/26/20 0539 02/27/20 0740 02/28/20 0152 02/28/20 1539 02/29/20 0526 03/01/20 0502  WBC 6.1  --  5.3 5.2  --  4.6 4.9  NEUTROABS  --   --   --   --   --  2.2 2.9  HGB 9.4*   < > 8.9* 7.7* 8.6* 7.9* 7.9*  HCT 29.2*   < > 28.6* 25.6* 27.9* 24.6* 25.0*  MCV 102.5*  --  106.7* 107.6*  --  102.9* 103.3*  PLT 251  --  244 212  --  216 209   < > = values in this interval not displayed.   Cardiac Enzymes:   No results for input(s): CKTOTAL, CKMB, CKMBINDEX, TROPONINI in the last 168 hours. BNP (last 3 results) No results for input(s): BNP in the last 8760 hours.  ProBNP (last 3 results) No results for input(s): PROBNP in the last 8760 hours.  CBG: No results for input(s): GLUCAP in the last 168 hours.  Recent Results (from the past 240 hour(s))  MRSA PCR Screening     Status: None   Collection Time: 02/25/20  9:08 PM   Specimen: Nasal Mucosa;  Nasopharyngeal  Result Value Ref Range Status   MRSA by PCR NEGATIVE NEGATIVE Final    Comment:        The GeneXpert MRSA Assay (FDA approved for NASAL specimens only), is one component of a comprehensive MRSA colonization surveillance program. It is not intended to diagnose MRSA infection nor to guide or monitor treatment for MRSA infections. Performed at [facility], [place] [address] [place], [place] [address]   SARS CORONAVIRUS 2 (TAT 6-24 HRS) Nasopharyngeal Nasopharyngeal Swab     Status: None   Collection Time: 02/29/20  2:56 PM  Specimen: Nasopharyngeal Swab  Result Value Ref Range Status   SARS Coronavirus 2 NEGATIVE NEGATIVE Final    Comment: (NOTE) SARS-CoV-2 target nucleic acids are NOT DETECTED. The SARS-CoV-2 RNA is generally detectable in upper and lower respiratory specimens during the acute phase of infection. Negative results do not preclude SARS-CoV-2 infection, do not rule out co-infections with other pathogens, and should not be used as the sole basis for treatment or other patient management decisions. Negative results must be combined with clinical observations, patient history, and epidemiological information. The expected result is Negative. Fact Sheet for Patients: SugarRoll.be Fact Sheet for Healthcare Providers: https://www.woods-mathews.com/ This test is not yet approved or cleared by the [place] FDA and  has been authorized for detection and/or diagnosis of SARS-CoV-2 by FDA under an Emergency Use Authorization (EUA). This EUA will remain  in effect (meaning this test can be used) for the duration of the COVID-19 declaration under Section 56 4(b)(1) of the Act, 21 U.S.C. section 360bbb-3(b)(1), unless the authorization is terminated or revoked sooner. Performed at [facility], [place] [address] [place], [place] [address]      Studies: No results found.  Scheduled  Meds: . levothyroxine  25 mcg Oral Q0600  . memantine  10 mg Oral Daily  . metoprolol tartrate  25 mg Oral BID  . pravastatin  20 mg Oral Daily    Continuous Infusions:     Alma Friendly MD  [facility]   03/01/2020, 5:22 PM  LOS: 6 days

## 2020-03-02 LAB — CBC WITH DIFFERENTIAL/PLATELET
Abs Immature Granulocytes: 0.02 10*3/uL (ref 0.00–0.07)
Basophils Absolute: 0 10*3/uL (ref 0.0–0.1)
Basophils Relative: 0 %
Eosinophils Absolute: 0.2 10*3/uL (ref 0.0–0.5)
Eosinophils Relative: 3 %
HCT: 27.6 % — ABNORMAL LOW (ref 36.0–46.0)
Hemoglobin: 8.8 g/dL — ABNORMAL LOW (ref 12.0–15.0)
Immature Granulocytes: 0 %
Lymphocytes Relative: 28 %
Lymphs Abs: 1.5 10*3/uL (ref 0.7–4.0)
MCH: 32.8 pg (ref 26.0–34.0)
MCHC: 31.9 g/dL (ref 30.0–36.0)
MCV: 103 fL — ABNORMAL HIGH (ref 80.0–100.0)
Monocytes Absolute: 0.6 10*3/uL (ref 0.1–1.0)
Monocytes Relative: 11 %
Neutro Abs: 3 10*3/uL (ref 1.7–7.7)
Neutrophils Relative %: 58 %
Platelets: 244 10*3/uL (ref 150–400)
RBC: 2.68 MIL/uL — ABNORMAL LOW (ref 3.87–5.11)
RDW: 14.1 % (ref 11.5–15.5)
WBC: 5.3 10*3/uL (ref 4.0–10.5)
nRBC: 0 % (ref 0.0–0.2)

## 2020-03-02 NOTE — Progress Notes (Signed)
Updated patients son Marlou Sa on the phone.

## 2020-03-02 NOTE — Progress Notes (Signed)
PROGRESS NOTE  Connie Baker LOV:[id number] DOB: 02-May-1929 DOA: 02/24/2020 PCP: Helen Hashimoto., MD   HPI/Recap of past 24 hours Patient is a 86 year old female with history of paroxysmal A. fib, hyperlipidemia, hypothyroidism who was admitted to [facility] on 11/13 with weakness, hypoxemia, dizziness for 1 week.  Patient was found to have acute anemia. Baseline hemoglobin is 13, hemoglobin at the facility was 9.0 with heme positive stool. On the day of transfer to [facility] hemoglobin dropped to 8.1 and was given 1 unit packed RBC transfusion.  Patient was transferred for GI evaluation to [facility]. Patient is on Xarelto which was stopped on admission at [facility]. Patient presented with unexplained hypoxemia, 2D echo was obtained which showed mild systolic CHF with EF 40 to 45%, moderate to severe MR.  In 2017, patient had a EF of 65% on Lexiscan stress test.  Patient had normal chest x-ray and normal BNP.     Today, patient noted to have poor appetite, refusing to eat.  Appears weak, has no specific complaints, just reports she does not feel good.    Assessment/Plan: Principal Problem:   Anemia Active Problems:   GI bleed   Acute systolic CHF (congestive heart failure) (HCC)   AF (paroxysmal atrial fibrillation) (HCC)   Hypothyroidism   Hyperlipidemia   Acute respiratory failure with hypoxia (HCC)   Dementia (HCC)   Chronic anticoagulation   Heme positive stool   Angiodysplasia of colon with hemorrhage   Benign neoplasm of sigmoid colon   Symptomatic anemia with GI bleed, acute on chronic blood loss anemia Baseline hemoglobin~13, presented with hemoglobin of 9.6-->8.1 FOBT positive S/p 1 unit of PRBC prior to transfer On 3/14 p.m. patient started having active rectal bleeding, painless GI on board, status post EGD/colonoscopy on 02/27/2020 which showed a single bleeding cecal angiectasia, treated successfully with APC, 114 mm polyp in the proximal sigmoid colon removed with a hot  snare, moderate diverticulosis.  EGD unremarkable GI had extensive discussion with daughter, agreed to discontinue home Xarelto and switch to low-dose aspirin.  Increased risk of stroke explained to daughter, who verbalized understanding.  Patient may begin aspirin 5 to 7 days post EGD Daily CBC  ?Chronic systolic CHF 2D echo done during the admission at [facility] showed EF of 45 to 50%, apically akinesis, moderate to severe mitral regurgitation Recommend outpatient follow-up with her cardiologist, currently no anginal symptoms, no acute shortness of breath, chest x-ray at [facility] showed no pulmonary edema Restart home dose Lasix  Paroxysmal AF with RVR HR better controlled Continue metoprolol 25 mg p.o. twice daily (home dose 50 mg PO BID), titrate as tolerated with BP Xarelto currently discontinued Continue telemetry  History of DVT Xarelto currently discontinued  Hypothyroidism TSH WNL Continue Synthroid  Hyperlipidemia Continue Pravachol  Dementia Currently stable, son does not believe patient has dementia, per son "patient has memory issues and word finding troubles at baseline", patient appears at baseline currently  Morbid obesity Lifestyle modification advised      DVT Prophylaxis: SCDs  Code Status: Full  Family Communication: Spoke to son on 03/02/2020  Disposition Plan:    Patient came from: home by herself, walks with a walker, son lives next door  Anticipated d/c place: SNF, awaiting bed placement  Barriers to d/c OR conditions which need to be met to effect a safe d/c: Awaiting bed placement   Consultants:  GI  Procedures:  EGD and colonoscopy on March 16  Antibiotics:  None   Objective: BP (!) 104/55 (BP Location: Left Arm)   Pulse 84   Temp 98.3 F (36.8 C) (Oral)   Resp 18   Ht '5\' 2"'  (1.575 m)   Wt 110 kg   SpO2 95%   BMI 44.35 kg/m    Intake/Output Summary (Last 24 hours) at 03/02/2020 1747 Last data filed at 03/02/2020 1700 Gross per 24 hour  Intake 580 ml  Output 500 ml  Net 80 ml   Filed Weights   02/26/20 1449 02/27/20 1104  Weight: 110 kg 110 kg    Exam:  General: NAD   Cardiovascular: S1, S2 present  Respiratory: CTAB  Abdomen: Soft, nontender, nondistended, bowel sounds present  Musculoskeletal: No bilateral pedal edema noted  Skin: Normal  Psychiatry: Normal mood   Data Reviewed: Basic Metabolic Panel: Recent Labs  Lab 02/26/20 0009 02/27/20 0740 02/28/20 0152 02/29/20 0526 03/01/20 0502  NA 146* 149* 143 141 143  K 4.1 4.2 3.7 3.8 3.8  CL 109 116* 114* 112* 112*  CO2 '27 24 23 23 24  ' GLUCOSE 114* 114* 99 98 101*  BUN '20 20 20 23 21  ' CREATININE 0.88 1.00 0.76 0.73 0.76  CALCIUM 8.2* 8.6* 8.2* 7.8* 8.1*   Liver Function Tests: No results for input(s): AST, ALT, ALKPHOS, BILITOT, PROT, ALBUMIN in the last 168 hours. No results for input(s): LIPASE, AMYLASE in the last 168 hours. No results for input(s): AMMONIA in the last 168 hours. CBC: Recent Labs  Lab 02/27/20 0740 02/27/20 0740 02/28/20 0152 02/28/20 1539 02/29/20 0526 03/01/20 0502 03/02/20 0438  WBC 5.3  --  5.2  --  4.6 4.9 5.3  NEUTROABS  --   --   --   --  2.2 2.9 3.0  HGB 8.9*   < > 7.7* 8.6* 7.9* 7.9* 8.8*  HCT 28.6*   < > 25.6* 27.9* 24.6* 25.0* 27.6*  MCV 106.7*  --  107.6*  --  102.9* 103.3* 103.0*  PLT 244  --  212  --  216 209 244   < > = values in this interval not displayed.   Cardiac Enzymes:   No results for input(s): CKTOTAL, CKMB, CKMBINDEX, TROPONINI in the last 168 hours. BNP (last 3 results) No results for input(s): BNP in the last 8760 hours.  ProBNP (last 3 results) No results for input(s): PROBNP in the last 8760 hours.  CBG: No results for input(s): GLUCAP in the last 168 hours.  Recent Results (from the past 240 hour(s))  MRSA PCR Screening     Status: None   Collection Time:  02/25/20  9:08 PM   Specimen: Nasal Mucosa; Nasopharyngeal  Result Value Ref Range Status   MRSA by PCR NEGATIVE NEGATIVE Final    Comment:        The GeneXpert MRSA Assay (FDA approved for NASAL specimens only), is one component of a comprehensive MRSA colonization surveillance program. It is not intended to diagnose MRSA infection nor to guide or monitor treatment for MRSA infections. Performed at [facility], [place] [address] [place], [place] [address]   SARS CORONAVIRUS 2 (TAT 6-24 HRS) Nasopharyngeal Nasopharyngeal Swab     Status: None   Collection Time: 02/29/20  2:56 PM  Specimen: Nasopharyngeal Swab  Result Value Ref Range Status   SARS Coronavirus 2 NEGATIVE NEGATIVE Final    Comment: (NOTE) SARS-CoV-2 target nucleic acids are NOT DETECTED. The SARS-CoV-2 RNA is generally detectable in upper and lower respiratory specimens during the acute phase of infection. Negative results do not preclude SARS-CoV-2 infection, do not rule out co-infections with other pathogens, and should not be used as the sole basis for treatment or other patient management decisions. Negative results must be combined with clinical observations, patient history, and epidemiological information. The expected result is Negative. Fact Sheet for Patients: SugarRoll.be Fact Sheet for Healthcare Providers: https://www.woods-mathews.com/ This test is not yet approved or cleared by the [place] FDA and  has been authorized for detection and/or diagnosis of SARS-CoV-2 by FDA under an Emergency Use Authorization (EUA). This EUA will remain  in effect (meaning this test can be used) for the duration of the COVID-19 declaration under Section 56 4(b)(1) of the Act, 21 U.S.C. section 360bbb-3(b)(1), unless the authorization is terminated or revoked sooner. Performed at [facility], [place] [address], [place],  [address]       Studies: No results found.  Scheduled Meds: . furosemide  20 mg Oral Daily  . levothyroxine  25 mcg Oral Q0600  . memantine  10 mg Oral Daily  . metoprolol tartrate  25 mg Oral BID  . pravastatin  20 mg Oral Daily    Continuous Infusions:     Alma Friendly MD  [facility]   03/02/2020, 5:47 PM  LOS: 7 days

## 2020-03-03 ENCOUNTER — Inpatient Hospital Stay (HOSPITAL_COMMUNITY): Payer: Medicare HMO

## 2020-03-03 LAB — CBC WITH DIFFERENTIAL/PLATELET
Abs Immature Granulocytes: 0.02 10*3/uL (ref 0.00–0.07)
Basophils Absolute: 0 10*3/uL (ref 0.0–0.1)
Basophils Relative: 0 %
Eosinophils Absolute: 0.2 10*3/uL (ref 0.0–0.5)
Eosinophils Relative: 5 %
HCT: 26.6 % — ABNORMAL LOW (ref 36.0–46.0)
Hemoglobin: 8.4 g/dL — ABNORMAL LOW (ref 12.0–15.0)
Immature Granulocytes: 1 %
Lymphocytes Relative: 33 %
Lymphs Abs: 1.5 10*3/uL (ref 0.7–4.0)
MCH: 32.1 pg (ref 26.0–34.0)
MCHC: 31.6 g/dL (ref 30.0–36.0)
MCV: 101.5 fL — ABNORMAL HIGH (ref 80.0–100.0)
Monocytes Absolute: 0.6 10*3/uL (ref 0.1–1.0)
Monocytes Relative: 12 %
Neutro Abs: 2.2 10*3/uL (ref 1.7–7.7)
Neutrophils Relative %: 49 %
Platelets: 246 10*3/uL (ref 150–400)
RBC: 2.62 MIL/uL — ABNORMAL LOW (ref 3.87–5.11)
RDW: 14.1 % (ref 11.5–15.5)
WBC: 4.4 10*3/uL (ref 4.0–10.5)
nRBC: 0 % (ref 0.0–0.2)

## 2020-03-03 NOTE — Progress Notes (Signed)
PROGRESS NOTE  Connie Baker:[id number] DOB: 1929/05/19 DOA: 02/24/2020 PCP: Helen Hashimoto., MD   HPI/Recap of past 24 hours Patient is a 86 year old female with history of paroxysmal A. fib, hyperlipidemia, hypothyroidism who was admitted to [facility] on 11/13 with weakness, hypoxemia, dizziness for 1 week.  Patient was found to have acute anemia. Baseline hemoglobin is 13, hemoglobin at the facility was 9.0 with heme positive stool. On the day of transfer to [facility] hemoglobin dropped to 8.1 and was given 1 unit packed RBC transfusion.  Patient was transferred for GI evaluation to [facility]. Patient is on Xarelto which was stopped on admission at [facility]. Patient presented with unexplained hypoxemia, 2D echo was obtained which showed mild systolic CHF with EF 40 to 45%, moderate to severe MR.  In 2017, patient had a EF of 65% on Lexiscan stress test.  Patient had normal chest x-ray and normal BNP.     Today, noted patient eating a little bit of breakfast. Has no specific new complaints.   Assessment/Plan: Principal Problem:   Anemia Active Problems:   GI bleed   Acute systolic CHF (congestive heart failure) (HCC)   AF (paroxysmal atrial fibrillation) (HCC)   Hypothyroidism   Hyperlipidemia   Acute respiratory failure with hypoxia (HCC)   Dementia (HCC)   Chronic anticoagulation   Heme positive stool   Angiodysplasia of colon with hemorrhage   Benign neoplasm of sigmoid colon   Symptomatic anemia with GI bleed, acute on chronic blood loss anemia Baseline hemoglobin~13, presented with hemoglobin of 9.6-->8.1 FOBT positive S/p 1 unit of PRBC prior to transfer On 3/14 p.m. patient started having active rectal bleeding, painless GI on board, status post EGD/colonoscopy on 02/27/2020 which showed a single bleeding cecal angiectasia, treated successfully with APC, 114 mm polyp in the proximal sigmoid colon removed with a hot snare, moderate diverticulosis.  EGD unremarkable GI  had extensive discussion with daughter, agreed to discontinue home Xarelto and switch to low-dose aspirin.  Increased risk of stroke explained to daughter, who verbalized understanding.  Patient may begin aspirin 5 to 7 days post EGD Daily CBC  ?Chronic systolic CHF 2D echo done during the admission at [facility] showed EF of 45 to 50%, apically akinesis, moderate to severe mitral regurgitation Recommend outpatient follow-up with her cardiologist, currently no anginal symptoms, no acute shortness of breath, chest x-ray at [facility] showed no pulmonary edema Restart home dose Lasix  Paroxysmal AF with RVR HR better controlled Continue metoprolol 25 mg p.o. twice daily (home dose 50 mg PO BID), titrate as tolerated with BP Xarelto currently discontinued Continue telemetry  History of DVT Xarelto currently discontinued  Hypothyroidism TSH WNL Continue Synthroid  Hyperlipidemia Continue Pravachol  Dementia Currently stable, son does not believe patient has dementia, per son "patient has memory issues and word finding troubles at baseline", patient appears at baseline currently  Morbid obesity Lifestyle modification advised      DVT Prophylaxis: SCDs  Code Status: Full  Family Communication: Spoke to son on 03/02/2020  Disposition Plan:    Patient came from: home by herself, walks with a walker, son lives next door  Anticipated d/c place: SNF, awaiting bed placement  Barriers to d/c OR conditions which need to be met to effect a safe d/c: Awaiting bed placement   Consultants:  GI  Procedures:  EGD and colonoscopy on March 16  Antibiotics:  None   Objective: BP (!) 124/50 (BP Location: Left Arm)   Pulse 86   Temp 98.6 F (37 C) (Oral)   Resp 17   Ht '5\' 2"'  (1.575 m)   Wt 110 kg   SpO2 97%   BMI 44.35 kg/m   Intake/Output Summary (Last 24 hours) at 03/03/2020  1640 Last data filed at 03/03/2020 1446 Gross per 24 hour  Intake 240 ml  Output 500 ml  Net -260 ml   Filed Weights   02/26/20 1449 02/27/20 1104  Weight: 110 kg 110 kg    Exam:  General: NAD   Cardiovascular: S1, S2 present  Respiratory: CTAB  Abdomen: Soft, nontender, nondistended, bowel sounds present  Musculoskeletal: No bilateral pedal edema noted  Skin: Normal  Psychiatry: Normal mood   Data Reviewed: Basic Metabolic Panel: Recent Labs  Lab 02/26/20 0009 02/27/20 0740 02/28/20 0152 02/29/20 0526 03/01/20 0502  NA 146* 149* 143 141 143  K 4.1 4.2 3.7 3.8 3.8  CL 109 116* 114* 112* 112*  CO2 '27 24 23 23 24  ' GLUCOSE 114* 114* 99 98 101*  BUN '20 20 20 23 21  ' CREATININE 0.88 1.00 0.76 0.73 0.76  CALCIUM 8.2* 8.6* 8.2* 7.8* 8.1*   Liver Function Tests: No results for input(s): AST, ALT, ALKPHOS, BILITOT, PROT, ALBUMIN in the last 168 hours. No results for input(s): LIPASE, AMYLASE in the last 168 hours. No results for input(s): AMMONIA in the last 168 hours. CBC: Recent Labs  Lab 02/28/20 0152 02/28/20 0152 02/28/20 1539 02/29/20 0526 03/01/20 0502 03/02/20 0438 03/03/20 0529  WBC 5.2  --   --  4.6 4.9 5.3 4.4  NEUTROABS  --   --   --  2.2 2.9 3.0 2.2  HGB 7.7*   < > 8.6* 7.9* 7.9* 8.8* 8.4*  HCT 25.6*   < > 27.9* 24.6* 25.0* 27.6* 26.6*  MCV 107.6*  --   --  102.9* 103.3* 103.0* 101.5*  PLT 212  --   --  216 209 244 246   < > = values in this interval not displayed.   Cardiac Enzymes:   No results for input(s): CKTOTAL, CKMB, CKMBINDEX, TROPONINI in the last 168 hours. BNP (last 3 results) No results for input(s): BNP in the last 8760 hours.  ProBNP (last 3 results) No results for input(s): PROBNP in the last 8760 hours.  CBG: No results for input(s): GLUCAP in the last 168 hours.  Recent Results (from the past 240 hour(s))  MRSA PCR Screening     Status: None   Collection Time: 02/25/20  9:08 PM   Specimen: Nasal Mucosa;  Nasopharyngeal  Result Value Ref Range Status   MRSA by PCR NEGATIVE NEGATIVE Final    Comment:        The GeneXpert MRSA Assay (FDA approved for NASAL specimens only), is one component of a comprehensive MRSA colonization surveillance program. It is not intended to diagnose MRSA infection nor to guide or monitor treatment for MRSA infections. Performed at [facility], [place] [address], [place], [place] [address]   SARS CORONAVIRUS 2 (TAT 6-24 HRS) Nasopharyngeal Nasopharyngeal Swab     Status: None   Collection Time: 02/29/20  2:56 PM   Specimen:  Nasopharyngeal Swab  Result Value Ref Range Status   SARS Coronavirus 2 NEGATIVE NEGATIVE Final    Comment: (NOTE) SARS-CoV-2 target nucleic acids are NOT DETECTED. The SARS-CoV-2 RNA is generally detectable in upper and lower respiratory specimens during the acute phase of infection. Negative results do not preclude SARS-CoV-2 infection, do not rule out co-infections with other pathogens, and should not be used as the sole basis for treatment or other patient management decisions. Negative results must be combined with clinical observations, patient history, and epidemiological information. The expected result is Negative. Fact Sheet for Patients: SugarRoll.be Fact Sheet for Healthcare Providers: https://www.woods-mathews.com/ This test is not yet approved or cleared by the [place] FDA and  has been authorized for detection and/or diagnosis of SARS-CoV-2 by FDA under an Emergency Use Authorization (EUA). This EUA will remain  in effect (meaning this test can be used) for the duration of the COVID-19 declaration under Section 56 4(b)(1) of the Act, 21 U.S.C. section 360bbb-3(b)(1), unless the authorization is terminated or revoked sooner. Performed at [facility], [place] [address] [place], [place] [address]      Studies: CT HEAD WO CONTRAST  Result Date:  03/03/2020 CLINICAL DATA:  Encephalopathy EXAM: CT HEAD WITHOUT CONTRAST TECHNIQUE: Contiguous axial images were obtained from the base of the skull through the vertex without intravenous contrast. COMPARISON:  2012 FINDINGS: Brain: There is no acute intracranial hemorrhage, mass effect, or edema. 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 There is no extra-axial fluid collection. Patchy and confluent areas of hypoattenuation in the supratentorial white matter are nonspecific but likely reflect slightly increased moderate chronic microvascular ischemic changes. Prominence of the ventricles and sulci reflecting parenchymal volume loss has increased. There is ex vacuo dilatation of the left lateral ventricle. Vascular: There is atherosclerotic calcification at the skull base. Skull: Calvarium is unremarkable. Sinuses/Orbits: No acute finding. Other: None. IMPRESSION: No acute intracranial hemorrhage. 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 Consider MRI if there is concern for acute ischemia. Chronic microvascular ischemic changes and parenchymal volume loss with some progression since 2012. Electronically Signed   By: Macy Mis M.D.   On: 03/03/2020 15:07    Scheduled Meds: . furosemide  20 mg Oral Daily  . levothyroxine  25 mcg Oral Q0600  . memantine  10 mg Oral Daily  . metoprolol tartrate  25 mg Oral BID  . pravastatin  20 mg Oral Daily    Continuous Infusions:     Alma Friendly MD  [facility]   03/03/2020, 4:40 PM  LOS: 8 days

## 2020-03-04 LAB — CBC WITH DIFFERENTIAL/PLATELET
Abs Immature Granulocytes: 0.01 10*3/uL (ref 0.00–0.07)
Basophils Absolute: 0 10*3/uL (ref 0.0–0.1)
Basophils Relative: 0 %
Eosinophils Absolute: 0.2 10*3/uL (ref 0.0–0.5)
Eosinophils Relative: 4 %
HCT: 28.1 % — ABNORMAL LOW (ref 36.0–46.0)
Hemoglobin: 8.9 g/dL — ABNORMAL LOW (ref 12.0–15.0)
Immature Granulocytes: 0 %
Lymphocytes Relative: 37 %
Lymphs Abs: 1.7 10*3/uL (ref 0.7–4.0)
MCH: 32.2 pg (ref 26.0–34.0)
MCHC: 31.7 g/dL (ref 30.0–36.0)
MCV: 101.8 fL — ABNORMAL HIGH (ref 80.0–100.0)
Monocytes Absolute: 0.5 10*3/uL (ref 0.1–1.0)
Monocytes Relative: 11 %
Neutro Abs: 2.3 10*3/uL (ref 1.7–7.7)
Neutrophils Relative %: 48 %
Platelets: 259 10*3/uL (ref 150–400)
RBC: 2.76 MIL/uL — ABNORMAL LOW (ref 3.87–5.11)
RDW: 14.2 % (ref 11.5–15.5)
WBC: 4.8 10*3/uL (ref 4.0–10.5)
nRBC: 0 % (ref 0.0–0.2)

## 2020-03-04 LAB — SARS CORONAVIRUS 2 (TAT 6-24 HRS): SARS Coronavirus 2: NEGATIVE

## 2020-03-04 MED ORDER — ENSURE ENLIVE PO LIQD
237.0000 mL | Freq: Three times a day (TID) | ORAL | Status: DC
  Administered 2020-03-04 – 2020-03-05 (×3): 237 mL via ORAL

## 2020-03-04 MED ORDER — METOPROLOL TARTRATE 5 MG/5ML IV SOLN
2.5000 mg | Freq: Once | INTRAVENOUS | Status: AC
  Administered 2020-03-04: 21:00:00 2.5 mg via INTRAVENOUS
  Filled 2020-03-04: qty 5

## 2020-03-04 MED ORDER — ASPIRIN EC 81 MG PO TBEC
81.0000 mg | DELAYED_RELEASE_TABLET | Freq: Every day | ORAL | Status: DC
  Administered 2020-03-04 – 2020-03-05 (×2): 81 mg via ORAL
  Filled 2020-03-04 (×2): qty 1

## 2020-03-04 MED ORDER — ADULT MULTIVITAMIN W/MINERALS CH
1.0000 | ORAL_TABLET | Freq: Every day | ORAL | Status: DC
  Administered 2020-03-04 – 2020-03-05 (×2): 1 via ORAL
  Filled 2020-03-04 (×2): qty 1

## 2020-03-04 NOTE — Progress Notes (Signed)
Initial Nutrition Assessment  DOCUMENTATION CODES:   Morbid obesity  INTERVENTION:  Ensure Enlive po TID, each supplement provides 350 kcal and 20 grams of protein  Magic cup BID with meals, each supplement provides 290 kcal and 9 grams of protein  CIB on breakfast tray, each supplement with 237 ml whole milk provides 220 kcal and 13 grams of protein  MVI with minerals daily  Monitor the need for nutrition support  Consider appetite stimulant  NUTRITION DIAGNOSIS:   Inadequate oral intake related to acute illness, decreased appetite as evidenced by per patient/family report(loss of appetite, meal completion 0-50%).  GOAL:   Patient will meet greater than or equal to 90% of their needs    MONITOR:   PO intake, Supplement acceptance, Labs, Weight trends, I & O's  REASON FOR ASSESSMENT:   Consult Assessment of nutrition requirement/status  ASSESSMENT:  86 year old female with past medical history of paroxysmal atrial fibrillation, CAD, HTN, HLD, hypothyroidism, history of stroke, history of uterine cancer, history of GI bleed, osteoarthritis, who was admitted to [facility] on 11/13 with complaints of 1 week history of weakness, hypoxemia, dizziness and found to have acute anemia and heme positive stool. Patient transferred to [facility] on 3/13 for GI evaluation due to gastroenterology specialist was not available at the facility for that week.  Diet advanced on 3/16 to heart healthy, patient consuming 0-50% (17% average) x 8 documented meals from 3/18 - 3/21  Patient awake, alert, and weepy this afternoon, breakfast tray in room and untouched. She reports that she is not hungry today, recalls eating 0% of dinner last night and did not feel like eating lunch today. She reports normally having a good appetite, stated she does not like to eat when feeling bad. Patient apologetic when unable to recall food preferences, stated I am so sorry, but I am just too tired to even think. RD  offered supportive listening, educated on the body's need for kcal/protein intake for energy and encouraged patient to eat meals and drink supplements. Patient  amenable to all flavors of Ensure and Magic Cup, reports that she will attempt po intake of meals later, but feeling exhausted and would like to sleep.   Generalized; BLE mild pitting edema noted per RN assessment.  Current wt 242 lbs No recent wt history for review, last weight prior to admission 95.4 kg (209.88 lbs) on 12/22/2017 I/Os: +3020 ml since admit    +235 ml x 24 hrs UOP: 125 ml x 24 hrs Medications reviewed and include: Lasix, Namenda  3/19 labs reviewed  NUTRITION - FOCUSED PHYSICAL EXAM:    Most Recent Value  Orbital Region  Mild depletion  Upper Arm Region  Mild depletion  Thoracic and Lumbar Region  Unable to assess  Buccal Region  Mild depletion  Temple Region  No depletion  Clavicle Bone Region  No depletion  Clavicle and Acromion Bone Region  No depletion  Scapular Bone Region  Unable to assess  Dorsal Hand  Mild depletion  Patellar Region  Unable to assess  Anterior Thigh Region  Unable to assess  Posterior Calf Region  Unable to assess  Edema (RD Assessment)  Mild  Hair  Reviewed  Eyes  Reviewed  Mouth  Reviewed  Skin  Reviewed  Nails  Reviewed       Diet Order:   Diet Order            Diet Heart Room service appropriate? Yes; Fluid consistency: Thin  Diet effective  now              EDUCATION NEEDS:   Education needs have been addressed  Skin:  Skin Assessment: Skin Integrity Issues: Skin Integrity Issues:: Other (Comment) Other: MASD; perineum  Last BM:  3/16 type 7  Height:   Ht Readings from Last 1 Encounters:  02/27/20 5\' 2"  (1.575 m)    Weight:   Wt Readings from Last 1 Encounters:  02/27/20 110 kg    Ideal Body Weight:  50 kg  BMI:  Body mass index is 44.35 kg/m.  Estimated Nutritional Needs:   Kcal:  1800-1950  Protein:  90-100  Fluid:  >/= 1.8  L/day    Lajuan Lines, RD, LDN Clinical Nutrition After Hours/Weekend Pager # in [place]

## 2020-03-04 NOTE — Progress Notes (Signed)
Physical Therapy Treatment Patient Details Name: Connie Baker MRN: [id number] DOB: Mar 01, 1929 Today's Date: 03/04/2020    History of Present Illness Patient is a 86 year old female with history of paroxysmal A. fib, hyperlipidemia, hypothyroidism, stroke, GI bleed. who was admitted 02/24/20 with anemia, hypoxia, dizziness.    PT Comments    Pt in bed. General Comments: following commands but tearful, appologitic and emotional due to R flank pain.  "I'm sorry Honey, I just can't to it" stated pt.  General bed mobility comments: required increased assist due to pain and emotional state.  General transfer comment: assisted from elevated bed to [facility] and back to bed 1/4 turn using walker and 50% VC's on turn completion and safety.  Limited due to pain. Assisted back to bed. Called RN for pain meds.    Follow Up Recommendations  SNF     Equipment Recommendations  None recommended by PT    Recommendations for Other Services       Precautions / Restrictions Precautions Precautions: Fall Precaution Comments: R flank pain, crying Restrictions Weight Bearing Restrictions: No    Mobility  Bed Mobility Overal bed mobility: Needs Assistance Bed Mobility: Supine to Sit;Sit to Supine     Supine to sit: Mod assist Sit to supine: Mod assist   General bed mobility comments: required increased assist due to pain and emotional state  Transfers Overall transfer level: Needs assistance Equipment used: Rolling walker (2 wheeled) Transfers: Sit to/from [facility] Sit to Stand: Min assist Stand pivot transfers: Mod assist       General transfer comment: assisted from elevated bed to [facility] and back to bed 1/4 turn using walker and 50% VC's on turn completion and safety.  Limited due to pain.  Ambulation/Gait             General Gait Details: transfer only this session due to R flank pain.   Stairs             Wheelchair Mobility    Modified Rankin (Stroke  Patients Only)       Balance                                            Cognition Arousal/Alertness: Awake/alert Behavior During Therapy: WFL for tasks assessed/performed Overall Cognitive Status: No family/caregiver present to determine baseline cognitive functioning                                 General Comments: following commands but tearful, appologitic and emotional due to R flank pain.  "I'm sorry Honey, I just can't to it" stated pt      Exercises      General Comments        Pertinent Vitals/Pain Pain Assessment: Faces Faces Pain Scale: Hurts even more Pain Location: R flank - reports from shingles years ago (observed skin intact) Pain Descriptors / Indicators: Crying Pain Intervention(s): Monitored during session;Patient requesting pain meds-RN notified    Home Living                      Prior Function            PT Goals (current goals can now be found in the care plan section) Progress towards PT goals: Progressing toward goals    Frequency  Min 2X/week      PT Plan Current plan remains appropriate    Co-evaluation              AM-PAC PT "6 Clicks" Mobility   Outcome Measure  Help needed turning from your back to your side while in a flat bed without using bedrails?: A Lot Help needed moving from lying on your back to sitting on the side of a flat bed without using bedrails?: A Lot Help needed moving to and from a bed to a chair (including a wheelchair)?: A Lot Help needed standing up from a chair using your arms (e.g., wheelchair or bedside chair)?: A Lot Help needed to walk in hospital room?: A Lot Help needed climbing 3-5 steps with a railing? : Total 6 Click Score: 11    End of Session Equipment Utilized During Treatment: Gait belt Activity Tolerance: No increased pain Patient left: in bed;with call bell/phone within reach;with bed alarm set Nurse Communication: Mobility status PT  Visit Diagnosis: Unsteadiness on feet (R26.81);Difficulty in walking, not elsewhere classified (R26.2)     Time: [id number] PT Time Calculation (min) (ACUTE ONLY): 13 min  Charges:  $Therapeutic Activity: 8-22 mins                     Rica Koyanagi  PTA Acute  Rehabilitation Services Pager      [phone] Office      [phone]

## 2020-03-04 NOTE — TOC Progression Note (Signed)
Transition of Care [facility]) - Progression Note    Patient Details  Name: Connie Baker MRN: [id number] Date of Birth: [birth year]  Transition of Care [facility]) CM/SW Contact  Joaquin Courts, RN Phone Number: 03/04/2020, 11:35 AM  Clinical Narrative:    CM followed up with [facility] rep who states insurance Josem Kaufmann is still pending.  MD notified that patient will need an updated Covid test in order to dc to SNF once insurance authorization is received.    Expected Discharge Plan: Skilled Nursing Facility Barriers to Discharge: Insurance Authorization  Expected Discharge Plan and Services Expected Discharge Plan: [place]   Discharge Planning Services: CM Consult   Living arrangements for the past 2 months: Single Family Home                                       Social Determinants of Health (SDOH) Interventions    Readmission Risk Interventions No flowsheet data found.

## 2020-03-04 NOTE — Care Management Important Message (Signed)
Important Message  Patient Details  IM Letter given to [name] RN Case Manager to present to the Patient Name: Connie Baker MRN: [id number] Date of Birth: July 27, 1929   Medicare Important Message Given:  Yes     Kerin Salen 03/04/2020, 1:32 PM

## 2020-03-04 NOTE — Progress Notes (Signed)
 PROGRESS NOTE  Connie Baker MRN:[id number] DOB: 10/16/1929 DOA: 02/24/2020 PCP: Campbell, Stephen D., MD   HPI/Recap of past 24 hours Patient is a 86-year-old female with history of paroxysmal A. fib, hyperlipidemia, hypothyroidism who was admitted to [facility] on 11/13 with weakness, hypoxemia, dizziness for 1 week.  Patient was found to have acute anemia. Baseline hemoglobin is 13, hemoglobin at the facility was 9.0 with heme positive stool. On the day of transfer to [facility] hemoglobin dropped to 8.1 and was given 1 unit packed RBC transfusion.  Patient was transferred for GI evaluation to [facility]. Patient is on Xarelto which was stopped on admission at [facility]. Patient presented with unexplained hypoxemia, 2D echo was obtained which showed mild systolic CHF with EF 40 to 45%, moderate to severe MR.  In 2017, patient had a EF of 65% on Lexiscan stress test.  Patient had normal chest x-ray and normal BNP.     Today, patient still reports not feeling good, but unable to explain further.  Noted to be intermittently tearful, noting any acute distress, reports poor appetite, refusing to eat.  Patient denies any chest pain, abdominal pain, nausea/vomiting, fever/chills.   Assessment/Plan: Principal Problem:   Anemia Active Problems:   GI bleed   Acute systolic CHF (congestive heart failure) (HCC)   AF (paroxysmal atrial fibrillation) (HCC)   Hypothyroidism   Hyperlipidemia   Acute respiratory failure with hypoxia (HCC)   Dementia (HCC)   Chronic anticoagulation   Heme positive stool   Angiodysplasia of colon with hemorrhage   Benign neoplasm of sigmoid colon   Symptomatic anemia with GI bleed, acute on chronic blood loss anemia Baseline hemoglobin~13, presented with hemoglobin of 9.6-->8.1 FOBT positive on presentation S/p 1 unit of PRBC prior to transfer On 3/14 p.m. patient started having active rectal bleeding, painless GI on board, status post EGD/colonoscopy on 02/27/2020 which  showed a single bleeding cecal angiectasia, treated successfully with APC, 114 mm polyp in the proximal sigmoid colon removed with a hot snare, moderate diverticulosis.  EGD unremarkable GI had extensive discussion with daughter, agreed to discontinue home Xarelto and switch to low-dose aspirin.  Increased risk of stroke explained to daughter, who verbalized understanding.  Patient may begin aspirin 5 to 7 days post EGD Daily CBC  ?Chronic systolic CHF 2D echo done during the admission at [facility] showed EF of 45 to 50%, apically akinesis, moderate to severe mitral regurgitation Recommend outpatient follow-up with her cardiologist, currently no anginal symptoms, no acute shortness of breath, chest x-ray at [facility] showed no pulmonary edema Restart home dose Lasix  Paroxysmal AF with RVR HR better controlled Continue metoprolol 25 mg p.o. twice daily (home dose 50 mg PO BID), titrate as tolerated with BP Xarelto currently discontinued Continue telemetry  History of DVT Xarelto currently discontinued  Hypothyroidism TSH WNL Continue Synthroid  Hyperlipidemia Continue Pravachol  Dementia Currently stable, son does not believe patient has dementia, per son "patient has memory issues and word finding troubles at baseline", patient appears at baseline currently  Morbid obesity Lifestyle modification advised      DVT Prophylaxis: SCDs  Code Status: Full  Family Communication: Spoke to son on 03/03/2020, discussed extensively about the need for SNF for patient.  No other acute medical issues noted  Disposition Plan:    Patient came from: home by herself, walks with a walker, son lives next door                                                                                                     Anticipated d/c place: SNF, awaiting bed placement  Barriers to d/c OR conditions which need to be met to effect a safe d/c: Awaiting bed placement   Consultants:  GI  Procedures:  EGD  and colonoscopy on March 16  Antibiotics:  None   Objective: BP (!) 115/57 (BP Location: Left Arm)   Pulse 82   Temp (!) 97.5 F (36.4 C) (Oral)   Resp 14   Ht 5' 2" (1.575 m)   Wt 110 kg   SpO2 96%   BMI 44.35 kg/m   Intake/Output Summary (Last 24 hours) at 03/04/2020 1446 Last data filed at 03/04/2020 0500 Gross per 24 hour  Intake 120 ml  Output 125 ml  Net -5 ml   Filed Weights   02/26/20 1449 02/27/20 1104  Weight: 110 kg 110 kg    Exam:  General: NAD, chronically ill-appearing, very deconditioned  Cardiovascular: S1, S2 present  Respiratory: CTAB  Abdomen: Soft, nontender, nondistended, bowel sounds present  Musculoskeletal: No bilateral pedal edema noted  Skin: Normal  Psychiatry: fair mood   Data Reviewed: Basic Metabolic Panel: Recent Labs  Lab 02/27/20 0740 02/28/20 0152 02/29/20 0526 03/01/20 0502  NA 149* 143 141 143  K 4.2 3.7 3.8 3.8  CL 116* 114* 112* 112*  CO2 _0 GLUCOSE 114* 99 98 101*  BUN _1 CREATININE 1.00 0.76 0.73 0.76  CALCIUM 8.6* 8.2* 7.8* 8.1*   Liver Function Tests: No results for input(s): AST, ALT, ALKPHOS, BILITOT, PROT, ALBUMIN in the last 168 hours. No results for input(s): LIPASE, AMYLASE in the last 168 hours. No results for input(s): AMMONIA in the last 168 hours. CBC: Recent Labs  Lab 02/29/20 0526 03/01/20 0502 03/02/20 0438 03/03/20 0529 03/04/20 0455  WBC 4.6 4.9 5.3 4.4 4.8  NEUTROABS 2.2 2.9 3.0 2.2 2.3  HGB 7.9* 7.9* 8.8* 8.4* 8.9*  HCT 24.6* 25.0* 27.6* 26.6* 28.1*  MCV 102.9* 103.3* 103.0* 101.5* 101.8*  PLT 216 209 244 246 259   Cardiac Enzymes:   No results for input(s): CKTOTAL, CKMB, CKMBINDEX, TROPONINI in the last 168 hours. BNP (last 3 results) No results for input(s): BNP in the last 8760 hours.  ProBNP (last 3 results) No results for input(s): PROBNP in the last 8760 hours.  CBG: No results for input(s): GLUCAP in the last 168 hours.  Recent Results  (from the past 240 hour(s))  MRSA PCR Screening     Status: None   Collection Time: 02/25/20  9:08 PM   Specimen: Nasal Mucosa; Nasopharyngeal  Result Value Ref Range Status   MRSA by PCR NEGATIVE NEGATIVE Final    Comment:        The GeneXpert MRSA Assay (FDA approved for NASAL specimens only), is one component of a comprehensive MRSA colonization surveillance program. It is not intended to diagnose MRSA infection nor to guide or monitor treatment for MRSA infections. Performed at [facility], [place] [address] [place], [place] [address]   SARS CORONAVIRUS 2 (TAT 6-24 HRS) Nasopharyngeal Nasopharyngeal Swab     Status: None   Collection Time: 02/29/20  2:56 PM   Specimen: Nasopharyngeal Swab  Result Value Ref Range Status   SARS Coronavirus 2 NEGATIVE NEGATIVE Final    Comment: (NOTE) SARS-CoV-2 target nucleic acids are NOT DETECTED. The SARS-CoV-2 RNA is generally detectable in upper and lower respiratory specimens during the acute phase of infection. Negative results do not preclude SARS-CoV-2 infection, do not rule out  co-infections with other pathogens, and should not be used as the sole basis for treatment or other patient management decisions. Negative results must be combined with clinical observations, patient history, and epidemiological information. The expected result is Negative. Fact Sheet for Patients: https://www.fda.gov/media/138098/download Fact Sheet for Healthcare Providers: https://www.fda.gov/media/138095/download This test is not yet approved or cleared by the [place] FDA and  has been authorized for detection and/or diagnosis of SARS-CoV-2 by FDA under an Emergency Use Authorization (EUA). This EUA will remain  in effect (meaning this test can be used) for the duration of the COVID-19 declaration under Section 56 4(b)(1) of the Act, 21 U.S.C. section 360bbb-3(b)(1), unless the authorization is terminated or revoked  sooner. Performed at [facility], [address] [place], [place] [address]      Studies: No results found.  Scheduled Meds: . feeding supplement (ENSURE ENLIVE)  237 mL Oral TID BM  . furosemide  20 mg Oral Daily  . levothyroxine  25 mcg Oral Q0600  . memantine  10 mg Oral Daily  . metoprolol tartrate  25 mg Oral BID  . multivitamin with minerals  1 tablet Oral Daily  . pravastatin  20 mg Oral Daily    Continuous Infusions:      J  MD  [facility]   03/04/2020, 2:46 PM  LOS: 9 days             

## 2020-03-04 NOTE — Progress Notes (Signed)
   03/04/20 1921  Vitals  ECG Heart Rate (!) 145  Cardiac Rhythm Atrial flutter  Ectopy Couplet PVC's (3bts run CN Michelle notified)  MEWS Score  MEWS Temp 0  MEWS Systolic 0  MEWS Pulse 3  MEWS RR 0  MEWS LOC 0  MEWS Score 3  MEWS Score Color Yellow  MEWS Assessment  Is this an acute change? No  Provider Notification  Provider Name/Title Baltazar Najjar  Date Provider Notified 03/04/20  Time Provider Notified 2040  Notification Type Page  Notification Reason Other (Comment) (Hx of Afib, in afib with PVC)  Response See new orders  Date of Provider Response 03/04/20  Time of Provider Response 2045

## 2020-03-05 ENCOUNTER — Telehealth: Payer: Self-pay | Admitting: Internal Medicine

## 2020-03-05 DIAGNOSIS — F4323 Adjustment disorder with mixed anxiety and depressed mood: Secondary | ICD-10-CM | POA: Diagnosis not present

## 2020-03-05 DIAGNOSIS — Z7901 Long term (current) use of anticoagulants: Secondary | ICD-10-CM | POA: Diagnosis not present

## 2020-03-05 DIAGNOSIS — R319 Hematuria, unspecified: Secondary | ICD-10-CM | POA: Diagnosis not present

## 2020-03-05 DIAGNOSIS — R0602 Shortness of breath: Secondary | ICD-10-CM | POA: Diagnosis not present

## 2020-03-05 DIAGNOSIS — F0150 Vascular dementia without behavioral disturbance: Secondary | ICD-10-CM | POA: Diagnosis not present

## 2020-03-05 DIAGNOSIS — K921 Melena: Secondary | ICD-10-CM

## 2020-03-05 DIAGNOSIS — N189 Chronic kidney disease, unspecified: Secondary | ICD-10-CM | POA: Diagnosis not present

## 2020-03-05 DIAGNOSIS — D649 Anemia, unspecified: Secondary | ICD-10-CM | POA: Diagnosis not present

## 2020-03-05 DIAGNOSIS — N390 Urinary tract infection, site not specified: Secondary | ICD-10-CM | POA: Diagnosis not present

## 2020-03-05 DIAGNOSIS — R262 Difficulty in walking, not elsewhere classified: Secondary | ICD-10-CM | POA: Diagnosis not present

## 2020-03-05 DIAGNOSIS — F419 Anxiety disorder, unspecified: Secondary | ICD-10-CM | POA: Diagnosis not present

## 2020-03-05 DIAGNOSIS — R58 Hemorrhage, not elsewhere classified: Secondary | ICD-10-CM | POA: Diagnosis not present

## 2020-03-05 DIAGNOSIS — I509 Heart failure, unspecified: Secondary | ICD-10-CM | POA: Diagnosis not present

## 2020-03-05 DIAGNOSIS — M2550 Pain in unspecified joint: Secondary | ICD-10-CM | POA: Diagnosis not present

## 2020-03-05 DIAGNOSIS — R109 Unspecified abdominal pain: Secondary | ICD-10-CM | POA: Diagnosis not present

## 2020-03-05 DIAGNOSIS — K5521 Angiodysplasia of colon with hemorrhage: Secondary | ICD-10-CM | POA: Diagnosis not present

## 2020-03-05 DIAGNOSIS — E039 Hypothyroidism, unspecified: Secondary | ICD-10-CM | POA: Diagnosis not present

## 2020-03-05 DIAGNOSIS — D62 Acute posthemorrhagic anemia: Secondary | ICD-10-CM | POA: Diagnosis not present

## 2020-03-05 DIAGNOSIS — I480 Paroxysmal atrial fibrillation: Secondary | ICD-10-CM | POA: Diagnosis not present

## 2020-03-05 DIAGNOSIS — R52 Pain, unspecified: Secondary | ICD-10-CM | POA: Diagnosis not present

## 2020-03-05 DIAGNOSIS — M25551 Pain in right hip: Secondary | ICD-10-CM | POA: Diagnosis not present

## 2020-03-05 DIAGNOSIS — Z86718 Personal history of other venous thrombosis and embolism: Secondary | ICD-10-CM | POA: Diagnosis not present

## 2020-03-05 DIAGNOSIS — I5021 Acute systolic (congestive) heart failure: Secondary | ICD-10-CM

## 2020-03-05 DIAGNOSIS — M25561 Pain in right knee: Secondary | ICD-10-CM | POA: Diagnosis not present

## 2020-03-05 DIAGNOSIS — I499 Cardiac arrhythmia, unspecified: Secondary | ICD-10-CM | POA: Diagnosis not present

## 2020-03-05 DIAGNOSIS — D500 Iron deficiency anemia secondary to blood loss (chronic): Secondary | ICD-10-CM | POA: Diagnosis not present

## 2020-03-05 DIAGNOSIS — E785 Hyperlipidemia, unspecified: Secondary | ICD-10-CM | POA: Diagnosis not present

## 2020-03-05 DIAGNOSIS — Z7401 Bed confinement status: Secondary | ICD-10-CM | POA: Diagnosis not present

## 2020-03-05 DIAGNOSIS — I5020 Unspecified systolic (congestive) heart failure: Secondary | ICD-10-CM | POA: Diagnosis not present

## 2020-03-05 DIAGNOSIS — Z79899 Other long term (current) drug therapy: Secondary | ICD-10-CM | POA: Diagnosis not present

## 2020-03-05 DIAGNOSIS — K922 Gastrointestinal hemorrhage, unspecified: Secondary | ICD-10-CM | POA: Diagnosis not present

## 2020-03-05 DIAGNOSIS — I10 Essential (primary) hypertension: Secondary | ICD-10-CM | POA: Diagnosis not present

## 2020-03-05 DIAGNOSIS — F0390 Unspecified dementia without behavioral disturbance: Secondary | ICD-10-CM | POA: Diagnosis not present

## 2020-03-05 DIAGNOSIS — Z20828 Contact with and (suspected) exposure to other viral communicable diseases: Secondary | ICD-10-CM | POA: Diagnosis not present

## 2020-03-05 LAB — BASIC METABOLIC PANEL
Anion gap: 8 (ref 5–15)
BUN: 24 mg/dL — ABNORMAL HIGH (ref 8–23)
CO2: 30 mmol/L (ref 22–32)
Calcium: 8.6 mg/dL — ABNORMAL LOW (ref 8.9–10.3)
Chloride: 100 mmol/L (ref 98–111)
Creatinine, Ser: 0.89 mg/dL (ref 0.44–1.00)
GFR calc Af Amer: >60 mL/min (ref >60)
GFR calc non Af Amer: 57 mL/min — ABNORMAL LOW (ref >60)
Glucose, Bld: 126 mg/dL — ABNORMAL HIGH (ref 70–99)
Potassium: 3.9 mmol/L (ref 3.5–5.1)
Sodium: 138 mmol/L (ref 135–145)

## 2020-03-05 LAB — CBC WITH DIFFERENTIAL/PLATELET
Abs Immature Granulocytes: 0.03 10*3/uL (ref 0.00–0.07)
Basophils Absolute: 0 10*3/uL (ref 0.0–0.1)
Basophils Relative: 0 %
Eosinophils Absolute: 0 10*3/uL (ref 0.0–0.5)
Eosinophils Relative: 0 %
HCT: 29 % — ABNORMAL LOW (ref 36.0–46.0)
Hemoglobin: 9.2 g/dL — ABNORMAL LOW (ref 12.0–15.0)
Immature Granulocytes: 0 %
Lymphocytes Relative: 16 %
Lymphs Abs: 1.4 10*3/uL (ref 0.7–4.0)
MCH: 31.8 pg (ref 26.0–34.0)
MCHC: 31.7 g/dL (ref 30.0–36.0)
MCV: 100.3 fL — ABNORMAL HIGH (ref 80.0–100.0)
Monocytes Absolute: 0.8 10*3/uL (ref 0.1–1.0)
Monocytes Relative: 9 %
Neutro Abs: 6.7 10*3/uL (ref 1.7–7.7)
Neutrophils Relative %: 75 %
Platelets: 266 10*3/uL (ref 150–400)
RBC: 2.89 MIL/uL — ABNORMAL LOW (ref 3.87–5.11)
RDW: 13.8 % (ref 11.5–15.5)
WBC: 9 10*3/uL (ref 4.0–10.5)
nRBC: 0 % (ref 0.0–0.2)

## 2020-03-05 MED ORDER — METOPROLOL TARTRATE 50 MG PO TABS
50.0000 mg | ORAL_TABLET | Freq: Two times a day (BID) | ORAL | Status: DC
  Administered 2020-03-05: 11:00:00 50 mg via ORAL
  Filled 2020-03-05: qty 1

## 2020-03-05 MED ORDER — METOPROLOL TARTRATE 50 MG PO TABS: 50.0000 mg | ORAL_TABLET | Freq: Two times a day (BID) | ORAL | Status: AC

## 2020-03-05 MED ORDER — ENSURE ENLIVE PO LIQD: 237.0000 mL | Freq: Three times a day (TID) | ORAL | 12 refills | Status: AC

## 2020-03-05 MED ORDER — ASPIRIN 81 MG PO TBEC: 81.0000 mg | DELAYED_RELEASE_TABLET | Freq: Every day | ORAL | Status: AC

## 2020-03-05 NOTE — Telephone Encounter (Signed)
Spoke with pts daughter and let her know Dr. Vena Rua recommendations.

## 2020-03-05 NOTE — Progress Notes (Signed)
Report given to Altha Harm, LPN at [facility] in [place]

## 2020-03-05 NOTE — Telephone Encounter (Signed)
Thanks Her blood counts are getting better.  They are continuing to work on her heart rates as it relates to her afib. I know she is awaiting SNF placement, which is new for her as she has been living alone at home before this hospital stay.  This will be a transition which I am sure will be somewhat stressful for the patient. I would encourage them to speak to their hospital doctor about these issues and concerns before she is discharged. If her daughter feels like she has unanswered questions, I would encourage her to reach out through the patient's nurse and schedule a meeting with her hospitalist medicine provider and other members of the care team if needed (I.e. social work).

## 2020-03-05 NOTE — Telephone Encounter (Signed)
She is still in the hospital, has not gone home yet and they have not been able to really speak with anyone about her very well at the hospital. It looks like GI signed off on the 18th.

## 2020-03-05 NOTE — Telephone Encounter (Signed)
Would see if she can come for lab work CBC, CMP, ferritin, IBC panel, TSH

## 2020-03-05 NOTE — Progress Notes (Signed)
Patient discharged to SNF via [facility]. 

## 2020-03-05 NOTE — Telephone Encounter (Signed)
Pt's daughter Connie Baker called to inform that pt is still at the hospital after her procedure. Pt had procedure last Tuesday, she seemed to be fine on Wednesday but from Thursday she began feeling weaker and weaker, she is not eating and Tammy said that she is not even talking as of yesterday. She would like to know what is going on. She states that nobody at the hospital has given her any answers.

## 2020-03-05 NOTE — Progress Notes (Signed)
Occupational Therapy Treatment Patient Details Name: Connie Baker MRN: [id number] DOB: 03-20-1929 Today's Date: 03/05/2020    History of present illness Patient is a 86 year old female with history of paroxysmal A. fib, hyperlipidemia, hypothyroidism, stroke, GI bleed. who was admitted 02/24/20 with anemia, hypoxia, dizziness.   OT comments  Pt in bed upon arrival; her son was present and very supportive. Pt required mod A +2 for be mobility to sit EOB. Session focused on functional activity/slefcare sitting EOB, sit - stand from EOB - RW, SPT to recliner. Pt required mod - min A +2 using RW with multimodal cues for sequencing and safety. Pt not standing fully upright and trying top sit done before recliner bend her. OT will continue to follow acutely  Follow Up Recommendations  SNF;Supervision/Assistance - 24 hour    Equipment Recommendations  Other (comment)(TBD at SNF)    Recommendations for Other Services      Precautions / Restrictions Precautions Precautions: Fall Precaution Comments: R flank pain, crying Restrictions Weight Bearing Restrictions: No       Mobility Bed Mobility Overal bed mobility: Needs Assistance Bed Mobility: Supine to Sit     Supine to sit: Mod assist;+2 for physical assistance Sit to supine: Supervision   General bed mobility comments: required increased assist due to pain and emotional state  Transfers Overall transfer level: Needs assistance Equipment used: Rolling walker (2 wheeled) Transfers: Sit to/from [facility] Sit to Stand: Min assist;+2 physical assistance Stand pivot transfers: Min assist;+2 physical assistance       General transfer comment: RN and OT assisted pt to recliner using RW, multimodal cues required for sequencing anf safety    Balance Overall balance assessment: Needs assistance Sitting-balance support: Feet supported;No upper extremity supported Sitting balance-Leahy Scale: Fair     Standing  balance support: During functional activity;Bilateral upper extremity supported Standing balance-Leahy Scale: Poor                             ADL either performed or assessed with clinical judgement   ADL Overall ADL's : Needs assistance/impaired Eating/Feeding: Set up;Sitting   Grooming: Wash/dry hands;Wash/dry face;Min guard;Sitting   Upper Body Bathing: Min guard;Sitting           Lower Body Dressing: Total assistance   Toilet Transfer: Minimal assistance;+2 for safety/equipment;Stand-pivot;RW;Cueing for safety;Cueing for sequencing Toilet Transfer Details (indicate cue type and reason): poor safety awareness, cues for body mechanics Toileting- Clothing Manipulation and Hygiene: Total assistance       Functional mobility during ADLs: Moderate assistance;Minimal assistance;Total assistance;Cueing for safety;Cueing for sequencing;Rolling walker       Vision Baseline Vision/History: Wears glasses Patient Visual Report: No change from baseline     Perception     Praxis      Cognition Arousal/Alertness: Awake/alert Behavior During Therapy: Flat affect Overall Cognitive Status: History of cognitive impairments - at baseline Area of Impairment: Orientation;Memory;Following commands;Safety/judgement;Awareness;Problem solving                     Memory: Decreased short-term memory Following Commands: Follows one step commands consistently Safety/Judgement: Decreased awareness of deficits;Decreased awareness of safety   Problem Solving: Difficulty sequencing;Requires verbal cues          Exercises     Shoulder Instructions       General Comments      Pertinent Vitals/ Pain       Pain Assessment: Faces Pain Score: 0-No pain  Faces Pain Scale: Hurts even more Pain Descriptors / Indicators: Crying;Grimacing;Guarding;Moaning Pain Intervention(s): Limited activity within patient's tolerance;Monitored during session;RN gave pain meds during  session;Repositioned  Home Living                                          Prior Functioning/Environment              Frequency  Min 2X/week        Progress Toward Goals  OT Goals(current goals can now be found in the care plan section)  Progress towards OT goals: OT to reassess next treatment     Plan Discharge plan remains appropriate    Co-evaluation                 AM-PAC OT "6 Clicks" Daily Activity     Outcome Measure   Help from another person eating meals?: None Help from another person taking care of personal grooming?: A Little Help from another person toileting, which includes using toliet, bedpan, or urinal?: A Lot Help from another person bathing (including washing, rinsing, drying)?: A Lot Help from another person to put on and taking off regular upper body clothing?: A Little Help from another person to put on and taking off regular lower body clothing?: A Lot 6 Click Score: 16    End of Session Equipment Utilized During Treatment: Gait belt;Rolling walker  OT Visit Diagnosis: Other abnormalities of gait and mobility (R26.89);Unsteadiness on feet (R26.81);Muscle weakness (generalized) (M62.81);Other symptoms and signs involving cognitive function;Pain Pain - part of body: (generalized)   Activity Tolerance Patient limited by fatigue;Patient limited by pain   Patient Left in chair;with call bell/phone within reach;with chair alarm set;with family/visitor present   Nurse Communication          Time: [id number] OT Time Calculation (min): 23 min  Charges: OT General Charges $OT Visit: 1 Visit OT Treatments $Self Care/Home Management : 8-22 mins $Therapeutic Activity: 8-22 mins     Britt Bottom 03/05/2020, 1:51 PM

## 2020-03-05 NOTE — Progress Notes (Deleted)
Occupational Therapy Treatment Patient Details Name: Connie Baker MRN: [id number] DOB: 03-16-1929 Today's Date: 03/05/2020    History of present illness Patient is a 86 year old female with history of paroxysmal A. fib, hyperlipidemia, hypothyroidism, stroke, GI bleed. who was admitted 02/24/20 with anemia, hypoxia, dizziness.   OT comments  Pt making good progress with functional goals. Pt used no AD during ADL mobility to bathroom. Session focused on LB ADLs, proper/safe body mechanics, fall prevention and dynamic standing balance during ADLs and functional tasks. OT will continue to follow acutely  Follow Up Recommendations  SNF;Supervision/Assistance - 24 hour    Equipment Recommendations       Recommendations for Other Services      Precautions / Restrictions Precautions Precautions: Fall Precaution Comments: R flank pain, crying Restrictions Weight Bearing Restrictions: No       Mobility Bed Mobility Overal bed mobility: Needs Assistance Bed Mobility: Supine to Sit;Sit to Supine     Supine to sit: Supervision Sit to supine: Supervision      Transfers Overall transfer level: Needs assistance Equipment used: None Transfers: Sit to/from [facility] Sit to Stand: Min guard Stand pivot transfers: Min guard       General transfer comment: no AD used to ambulate to bathroom, held onto IV pole    Balance Overall balance assessment: Needs assistance Sitting-balance support: Feet supported;No upper extremity supported Sitting balance-Leahy Scale: Good     Standing balance support: During functional activity;Single extremity supported Standing balance-Leahy Scale: Fair                             ADL either performed or assessed with clinical judgement   ADL Overall ADL's : Needs assistance/impaired                     Lower Body Dressing: Minimal assistance;Sitting/lateral leans;Sit to/from stand   Toilet Transfer:  Min guard;Ambulation;Cueing for safety   Toileting- Clothing Manipulation and Hygiene: Min guard;Sit to/from stand       Functional mobility during ADLs: Min guard;Cueing for safety       Vision Baseline Vision/History: Wears glasses Patient Visual Report: No change from baseline     Perception     Praxis      Cognition Arousal/Alertness: Awake/alert Behavior During Therapy: WFL for tasks assessed/performed Overall Cognitive Status: Within Functional Limits for tasks assessed                                          Exercises     Shoulder Instructions       General Comments      Pertinent Vitals/ Pain       Pain Assessment: No/denies pain Pain Score: 0-No pain Pain Intervention(s): Monitored during session  Home Living                                          Prior Functioning/Environment              Frequency  Min 2X/week        Progress Toward Goals  OT Goals(current goals can now be found in the care plan section)  Progress towards OT goals: Progressing toward goals     Plan Discharge plan  remains appropriate    Co-evaluation                 AM-PAC OT "6 Clicks" Daily Activity     Outcome Measure   Help from another person eating meals?: None Help from another person taking care of personal grooming?: A Little Help from another person toileting, which includes using toliet, bedpan, or urinal?: A Little Help from another person bathing (including washing, rinsing, drying)?: A Little Help from another person to put on and taking off regular upper body clothing?: None Help from another person to put on and taking off regular lower body clothing?: A Little 6 Click Score: 20    End of Session Equipment Utilized During Treatment: Gait belt  OT Visit Diagnosis: Other abnormalities of gait and mobility (R26.89);Unsteadiness on feet (R26.81);Muscle weakness (generalized) (M62.81);Other symptoms and  signs involving cognitive function;Pain   Activity Tolerance Patient tolerated treatment well   Patient Left in bed;with call bell/phone within reach   Nurse Communication          Time: 1000-1031 OT Time Calculation (min): 31 min  Charges: OT General Charges $OT Visit: 1 Visit OT Treatments $Self Care/Home Management : 8-22 mins $Therapeutic Activity: 8-22 mins    Britt Bottom 03/05/2020, 1:22 PM

## 2020-03-05 NOTE — Telephone Encounter (Signed)
Pts daughter calling concerned about her mother. States after she has ECL with [facility] last week she was sitting up in chair and doing well. Report Thursday she was weak and not eating, Friday even weaker and not eating. Sat she was lying in the bed, hardly speaking, states she is tired. Sunday was worse she was lying in bed with eyes closed, hard to get her to speak. Finally daughter got some ensure in her but she will not eat any food. Concerned and wanted to touch base with Dr. Hilarie Fredrickson. Feel something is wrong or their mother has just given up. Please advise.

## 2020-03-05 NOTE — TOC Transition Note (Signed)
Transition of Care [facility]) - CM/SW Discharge Note   Patient Details  Name: Connie Baker MRN: [id number] Date of Birth: 08/22/1929  [facility]) CM/SW Contact:  Dessa Phi, RN Phone Number: 03/05/2020, 12:07 PM   Clinical Narrative:  Josem Kaufmann received from insurance by facility rep Linus Orn. Informed Dean son of [facility] having a bed, & auth-going to rm 706,nurse call report tel# [phone]. Await d/c summary to send to [facility],then will call [facility].    Final next level of care: Skilled Nursing Facility Barriers to Discharge: No Barriers Identified   Patient Goals and CMS Choice Patient states their goals for this hospitalization and ongoing recovery are:: go to rehab      Discharge Placement   Existing PASRR number confirmed : 03/01/20          Patient chooses bed at: [facility] [place] Patient to be transferred to facility by: [facility] Name of family member notified: Marlou Sa son Patient and family notified of of transfer: 03/05/20  Discharge Plan and Services   Discharge Planning Services: CM Consult                                 Social Determinants of Health ([employer]) Interventions     Readmission Risk Interventions No flowsheet data found.

## 2020-03-05 NOTE — Discharge Summary (Signed)
Discharge Summary  Connie Baker [id number] DOB: 09/02/1929  PCP: Helen Hashimoto., MD  Admit date: 02/24/2020 Discharge date: 03/05/2020  Time spent: 40 mins  Recommendations for Outpatient Follow-up:  1. PCP in 1 week 2. GI as recommended  Discharge Diagnoses:  Active Hospital Problems   Diagnosis Date Noted  . Anemia 02/24/2020  . Heme positive stool   . Angiodysplasia of colon with hemorrhage   . Benign neoplasm of sigmoid colon   . Chronic anticoagulation   . GI bleed 02/25/2020  . Acute systolic CHF (congestive heart failure) ([place]) 02/25/2020  . AF (paroxysmal atrial fibrillation) ([place]) 02/25/2020  . Hypothyroidism 02/25/2020  . Hyperlipidemia 02/25/2020  . Acute respiratory failure with hypoxia ([place]) 02/25/2020  . Dementia ([place]) 02/25/2020    Resolved Hospital Problems  No resolved problems to display.    Discharge Condition: Stable  Diet recommendation: As tolerated  Vitals:   03/04/20 2144 03/05/20 0417  BP: 138/69 117/61  Pulse: 81 93  Resp: (!) 24 20  Temp: 99.4 F (37.4 C) 97.9 F (36.6 C)  SpO2: 100% 99%    History of present illness:  Patient is a 86 year old female with history of paroxysmal A. fib, hyperlipidemia, hypothyroidism who was admitted to [facility] on 11/13 with weakness, hypoxemia, dizziness for 1 week. Patient was found to have acute anemia. Baseline hemoglobin is 13, hemoglobin at the facility was 9.0 with heme positive stool. On the day of transfer to [facility] hemoglobin dropped to 8.1 and was given 1 unit packed RBC transfusion. Patient was transferred for GI evaluation to [facility]. Patient is on Xarelto which was stopped on admission at [facility]. Patient presented with unexplained hypoxemia, 2D echo was obtained which showed mild systolic CHF with EF 40 to 45%, moderate to severe MR. In 2017, patient had a EF of 65% on Lexiscan stress test. Patient had normal chest x-ray and normal BNP.     Today, patient continues to have  nonspecific complaints, appears very deconditioned with generalized weakness continues to have a very poor appetite.  Able to move all extremities equally, looks pretty comfortable. Been having extensive conversation with patient son Marlou Sa for the past couple of days, about overall patient's picture.  Patient vital signs and labs have been stable, afebrile, hemoglobin steadily improved, no signs of overt bleeding.  CT head unremarkable except for chronic ischemia.  Patient has continuously been refusing to eat, noted to be very deconditioned, but not disoriented.  Had multiple conversation with son about patient's dementia possibly playing a huge role while in the hospital, and her decline, especially refusing to eat and being intermittently teary.  Prior to coming into the hospital son acknowledged that patient has been very forgetful, and was never a big eater.  Discussed the trajectory of dementia and how it can progress.  Being in the hospital bed almost 24 hours a day will certainly not help her situation, so recommend SNF for rehab.  If patient continues to decline, may need palliative/hospice consultation as an outpatient and goals of care readdressed.  Son would like to continue to do everything medically possible to get patient back to baseline.    Hospital Course:  Principal Problem:   Anemia Active Problems:   GI bleed   Acute systolic CHF (congestive heart failure) (HCC)   AF (paroxysmal atrial fibrillation) (HCC)   Hypothyroidism   Hyperlipidemia   Acute respiratory failure with hypoxia (HCC)   Dementia (HCC)   Chronic anticoagulation   Heme positive stool  Angiodysplasia of colon with hemorrhage   Benign neoplasm of sigmoid colon   Symptomatic anemia with GI bleed, acute on chronic blood loss anemia Baseline hemoglobin~13, presented with hemoglobin of 9.6 FOBT positive on presentation S/p 1 unit of PRBC prior to transfer On 3/14 p.m. patient started having active rectal  bleeding, painless GI on board, status post EGD/colonoscopy on 02/27/2020 which showed a single bleeding cecal angiectasia, treated successfully with APC, 114 mm polyp in the proximal sigmoid colon removed with a hot snare, moderate diverticulosis.  EGD unremarkable GI had extensive discussion with daughter, agreed to discontinue home Xarelto and switch to low-dose aspirin.  Increased risk of stroke explained to daughter, who verbalized understanding.  Patient may begin aspirin 5 to 7 days post EGD  Chronic systolic CHF 2D echo done during the admission at [facility] showed EF of 45 to 50%, apically akinesis, moderate to severe mitral regurgitation Recommend outpatient follow-up with her cardiologist, currently no anginal symptoms, no acute shortness of breath, chest x-ray at [facility] showed no pulmonary edema Continue home dose Lasix  ParoxysmalAF with RVR HR better controlled Continue metoprolol 50 mgPO BID as BP permits Xarelto currently discontinued due to GIB, start ASA 81 mg  History of DVT Xarelto currently discontinued  Hypothyroidism TSH WNL Continue Synthroid  Hyperlipidemia Continue Pravachol  Dementia Continue Nemanda  May need outpatient palliative/hospice consult   Morbid obesity Lifestyle modification advised          Malnutrition Type:  Nutrition Problem: Inadequate oral intake Etiology: acute illness, decreased appetite   Malnutrition Characteristics:  Signs/Symptoms: per patient/family report(loss of appetite, meal completion 0-50%)   Nutrition Interventions:  Interventions: Ensure Enlive (each supplement provides 350kcal and 20 grams of protein), MVI, Magic cup, Carnation Instant Breakfast   Estimated body mass index is 44.35 kg/m as calculated from the following:   Height as of this encounter: 5\' 2"  (1.575 m).   Weight as of this encounter: 110 kg.    Procedures:  EGD and colonoscopy on 02/27/2020  Consultations:  GI  Discharge Exam: BP  117/61 (BP Location: Left Arm)   Pulse 93   Temp 97.9 F (36.6 C) (Oral)   Resp 20   Ht 5\' 2"  (1.575 m)   Wt 110 kg   SpO2 99%   BMI 44.35 kg/m    General: NAD, chronically ill-appearing, very deconditioned  Cardiovascular: S1, S2 present Respiratory: CTAB    Discharge Instructions You were cared for by a hospitalist during your hospital stay. If you have any questions about your discharge medications or the care you received while you were in the hospital after you are discharged, you can call the unit and asked to speak with the hospitalist on call if the hospitalist that took care of you is not available. Once you are discharged, your primary care physician will handle any further medical issues. Please note that NO REFILLS for any discharge medications will be authorized once you are discharged, as it is imperative that you return to your primary care physician (or establish a relationship with a primary care physician if you do not have one) for your aftercare needs so that they can reassess your need for medications and monitor your lab values.  Discharge Instructions    Diet - low sodium heart healthy   Complete by: As directed    Increase activity slowly   Complete by: As directed      Allergies as of 03/05/2020      Reactions   Sulfa Antibiotics  Medication List    STOP taking these medications   loratadine 10 MG tablet Commonly known as: CLARITIN   Xarelto 20 MG Tabs tablet Generic drug: rivaroxaban     TAKE these medications   acetaminophen 500 MG tablet Commonly known as: TYLENOL Take 1,000 mg by mouth every 6 (six) hours as needed for moderate pain.   aspirin 81 MG EC tablet Take 1 tablet (81 mg total) by mouth daily. Start taking on: March 06, 2020   cholecalciferol 25 MCG (1000 UNIT) tablet Commonly known as: VITAMIN D3 Take 1,000 Units by mouth daily.   feeding supplement (ENSURE ENLIVE) Liqd Take 237 mLs by mouth 3 (three) times daily  between meals.   furosemide 20 MG tablet Commonly known as: LASIX Take 20 mg by mouth daily.   gabapentin 300 MG capsule Commonly known as: NEURONTIN TAKE 2 CAPSULES BY MOUTH IN THE MORNING, THEN TAKE 1 CAPSULE IN THE AFTERNOON, THEN TAKE 1 CAPSULE AT BEDTIME What changed: See the new instructions.   levothyroxine 25 MCG tablet Commonly known as: SYNTHROID Take 25 mcg by mouth daily before breakfast.   memantine 10 MG tablet Commonly known as: NAMENDA Take 10 mg by mouth 2 (two) times daily.   metoprolol tartrate 50 MG tablet Commonly known as: LOPRESSOR Take 1 tablet (50 mg total) by mouth 2 (two) times daily. What changed: how much to take   multivitamin with minerals Tabs tablet Take 1 tablet by mouth daily.   pravastatin 20 MG tablet Commonly known as: PRAVACHOL Take 20 mg by mouth at bedtime.      Allergies  Allergen Reactions  . Sulfa Antibiotics    Follow-up Information    Helen Hashimoto., MD. Schedule an appointment as soon as possible for a visit in 1 week(s).   Specialty: Internal Medicine Contact information: [place] [id number] [phone]            The results of significant diagnostics from this hospitalization (including imaging, microbiology, ancillary and laboratory) are listed below for reference.    Significant Diagnostic Studies: CT HEAD WO CONTRAST  Result Date: 03/03/2020 CLINICAL DATA:  Encephalopathy EXAM: CT HEAD WITHOUT CONTRAST TECHNIQUE: Contiguous axial images were obtained from the base of the skull through the vertex without intravenous contrast. COMPARISON:  2012 FINDINGS: Brain: There is no acute intracranial hemorrhage, mass effect, or edema. 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 There is no extra-axial fluid collection. Patchy and confluent areas of hypoattenuation in the supratentorial white matter are nonspecific but likely reflect slightly increased moderate chronic  microvascular ischemic changes. Prominence of the ventricles and sulci reflecting parenchymal volume loss has increased. There is ex vacuo dilatation of the left lateral ventricle. Vascular: There is atherosclerotic calcification at the skull base. Skull: Calvarium is unremarkable. Sinuses/Orbits: No acute finding. Other: None. IMPRESSION: No acute intracranial hemorrhage. 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 Consider MRI if there is concern for acute ischemia. Chronic microvascular ischemic changes and parenchymal volume loss with some progression since 2012. Electronically Signed   By: Macy Mis M.D.   On: 03/03/2020 15:07    Microbiology: Recent Results (from the past 240 hour(s))  MRSA PCR Screening     Status: None   Collection Time: 02/25/20  9:08 PM   Specimen: Nasal Mucosa; Nasopharyngeal  Result Value Ref Range Status   MRSA by PCR NEGATIVE NEGATIVE Final    Comment:  The GeneXpert MRSA Assay (FDA approved for NASAL specimens only), is one component of a comprehensive MRSA colonization surveillance program. It is not intended to diagnose MRSA infection nor to guide or monitor treatment for MRSA infections. Performed at [facility], [place] [address] [place], [place] [address]   SARS CORONAVIRUS 2 (TAT 6-24 HRS) Nasopharyngeal Nasopharyngeal Swab     Status: None   Collection Time: 02/29/20  2:56 PM   Specimen: Nasopharyngeal Swab  Result Value Ref Range Status   SARS Coronavirus 2 NEGATIVE NEGATIVE Final    Comment: (NOTE) SARS-CoV-2 target nucleic acids are NOT DETECTED. The SARS-CoV-2 RNA is generally detectable in upper and lower respiratory specimens during the acute phase of infection. Negative results do not preclude SARS-CoV-2 infection, do not rule out co-infections with other pathogens, and should not be used as the sole basis for treatment or other patient management decisions. Negative results must be combined with  clinical observations, patient history, and epidemiological information. The expected result is Negative. Fact Sheet for Patients: SugarRoll.be Fact Sheet for Healthcare Providers: https://www.woods-mathews.com/ This test is not yet approved or cleared by the [place] FDA and  has been authorized for detection and/or diagnosis of SARS-CoV-2 by FDA under an Emergency Use Authorization (EUA). This EUA will remain  in effect (meaning this test can be used) for the duration of the COVID-19 declaration under Section 56 4(b)(1) of the Act, 21 U.S.C. section 360bbb-3(b)(1), unless the authorization is terminated or revoked sooner. Performed at [facility], [place] [address] [place], [place] [address]   SARS CORONAVIRUS 2 (TAT 6-24 HRS) Nasopharyngeal Nasopharyngeal Swab     Status: None   Collection Time: 03/04/20  1:27 PM   Specimen: Nasopharyngeal Swab  Result Value Ref Range Status   SARS Coronavirus 2 NEGATIVE NEGATIVE Final    Comment: (NOTE) SARS-CoV-2 target nucleic acids are NOT DETECTED. The SARS-CoV-2 RNA is generally detectable in upper and lower respiratory specimens during the acute phase of infection. Negative results do not preclude SARS-CoV-2 infection, do not rule out co-infections with other pathogens, and should not be used as the sole basis for treatment or other patient management decisions. Negative results must be combined with clinical observations, patient history, and epidemiological information. The expected result is Negative. Fact Sheet for Patients: SugarRoll.be Fact Sheet for Healthcare Providers: https://www.woods-mathews.com/ This test is not yet approved or cleared by the [place] FDA and  has been authorized for detection and/or diagnosis of SARS-CoV-2 by FDA under an Emergency Use Authorization (EUA). This EUA will remain  in effect (meaning this test can  be used) for the duration of the COVID-19 declaration under Section 56 4(b)(1) of the Act, 21 U.S.C. section 360bbb-3(b)(1), unless the authorization is terminated or revoked sooner. Performed at [facility], [place] [address], [place], [place] [address]      Labs: Basic Metabolic Panel: Recent Labs  Lab 02/28/20 0152 02/29/20 0526 03/01/20 0502 03/05/20 0457  NA 143 141 143 138  K 3.7 3.8 3.8 3.9  CL 114* 112* 112* 100  CO2 23 23 24 30   GLUCOSE 99 98 101* 126*  BUN 20 23 21  24*  CREATININE 0.76 0.73 0.76 0.89  CALCIUM 8.2* 7.8* 8.1* 8.6*   Liver Function Tests: No results for input(s): AST, ALT, ALKPHOS, BILITOT, PROT, ALBUMIN in the last 168 hours. No results for input(s): LIPASE, AMYLASE in the last 168 hours. No results for input(s): AMMONIA in the last 168 hours. CBC: Recent  Labs  Lab 03/01/20 0502 03/02/20 0438 03/03/20 0529 03/04/20 0455 03/05/20 0457  WBC 4.9 5.3 4.4 4.8 9.0  NEUTROABS 2.9 3.0 2.2 2.3 6.7  HGB 7.9* 8.8* 8.4* 8.9* 9.2*  HCT 25.0* 27.6* 26.6* 28.1* 29.0*  MCV 103.3* 103.0* 101.5* 101.8* 100.3*  PLT 209 244 246 259 266   Cardiac Enzymes: No results for input(s): CKTOTAL, CKMB, CKMBINDEX, TROPONINI in the last 168 hours. BNP: BNP (last 3 results) No results for input(s): BNP in the last 8760 hours.  ProBNP (last 3 results) No results for input(s): PROBNP in the last 8760 hours.  CBG: No results for input(s): GLUCAP in the last 168 hours.     Signed:  Alma Friendly, MD [facility] 03/05/2020, 1:05 PM

## 2020-03-07 DIAGNOSIS — F0390 Unspecified dementia without behavioral disturbance: Secondary | ICD-10-CM | POA: Diagnosis not present

## 2020-03-09 DIAGNOSIS — K922 Gastrointestinal hemorrhage, unspecified: Secondary | ICD-10-CM | POA: Diagnosis not present

## 2020-03-09 DIAGNOSIS — D649 Anemia, unspecified: Secondary | ICD-10-CM | POA: Diagnosis not present

## 2020-03-09 DIAGNOSIS — I509 Heart failure, unspecified: Secondary | ICD-10-CM | POA: Diagnosis not present

## 2020-03-09 DIAGNOSIS — R262 Difficulty in walking, not elsewhere classified: Secondary | ICD-10-CM | POA: Diagnosis not present

## 2020-03-11 DIAGNOSIS — D649 Anemia, unspecified: Secondary | ICD-10-CM | POA: Diagnosis not present

## 2020-03-12 DIAGNOSIS — N390 Urinary tract infection, site not specified: Secondary | ICD-10-CM | POA: Diagnosis not present

## 2020-03-12 DIAGNOSIS — R319 Hematuria, unspecified: Secondary | ICD-10-CM | POA: Diagnosis not present

## 2020-03-18 DIAGNOSIS — Z20828 Contact with and (suspected) exposure to other viral communicable diseases: Secondary | ICD-10-CM | POA: Diagnosis not present

## 2020-03-21 DIAGNOSIS — R0602 Shortness of breath: Secondary | ICD-10-CM | POA: Diagnosis not present

## 2020-03-21 DIAGNOSIS — F0390 Unspecified dementia without behavioral disturbance: Secondary | ICD-10-CM | POA: Diagnosis not present

## 2020-03-21 DIAGNOSIS — N189 Chronic kidney disease, unspecified: Secondary | ICD-10-CM | POA: Diagnosis not present

## 2020-03-21 DIAGNOSIS — I10 Essential (primary) hypertension: Secondary | ICD-10-CM | POA: Diagnosis not present

## 2020-03-21 DIAGNOSIS — F419 Anxiety disorder, unspecified: Secondary | ICD-10-CM | POA: Diagnosis not present

## 2020-03-21 DIAGNOSIS — F4323 Adjustment disorder with mixed anxiety and depressed mood: Secondary | ICD-10-CM | POA: Diagnosis not present

## 2020-03-21 DIAGNOSIS — D649 Anemia, unspecified: Secondary | ICD-10-CM | POA: Diagnosis not present

## 2020-03-21 DIAGNOSIS — Z79899 Other long term (current) drug therapy: Secondary | ICD-10-CM | POA: Diagnosis not present

## 2020-03-22 ENCOUNTER — Ambulatory Visit: Payer: Medicare HMO | Admitting: Gastroenterology

## 2020-04-04 DIAGNOSIS — F419 Anxiety disorder, unspecified: Secondary | ICD-10-CM | POA: Diagnosis not present

## 2020-04-04 DIAGNOSIS — F4323 Adjustment disorder with mixed anxiety and depressed mood: Secondary | ICD-10-CM | POA: Diagnosis not present

## 2020-04-04 DIAGNOSIS — F0390 Unspecified dementia without behavioral disturbance: Secondary | ICD-10-CM | POA: Diagnosis not present

## 2020-04-09 ENCOUNTER — Ambulatory Visit: Payer: Medicare HMO | Admitting: Gastroenterology

## 2020-04-09 DIAGNOSIS — F0390 Unspecified dementia without behavioral disturbance: Secondary | ICD-10-CM | POA: Diagnosis not present

## 2020-04-09 DIAGNOSIS — R5381 Other malaise: Secondary | ICD-10-CM | POA: Diagnosis not present

## 2020-04-09 DIAGNOSIS — E039 Hypothyroidism, unspecified: Secondary | ICD-10-CM | POA: Diagnosis not present

## 2020-04-09 DIAGNOSIS — I4891 Unspecified atrial fibrillation: Secondary | ICD-10-CM | POA: Diagnosis not present

## 2020-04-12 DIAGNOSIS — R0602 Shortness of breath: Secondary | ICD-10-CM | POA: Diagnosis not present

## 2020-04-12 DIAGNOSIS — D649 Anemia, unspecified: Secondary | ICD-10-CM | POA: Diagnosis not present

## 2020-04-12 DIAGNOSIS — J811 Chronic pulmonary edema: Secondary | ICD-10-CM | POA: Diagnosis not present

## 2020-04-12 DIAGNOSIS — Z79899 Other long term (current) drug therapy: Secondary | ICD-10-CM | POA: Diagnosis not present

## 2020-04-12 DIAGNOSIS — I509 Heart failure, unspecified: Secondary | ICD-10-CM | POA: Diagnosis not present

## 2020-04-16 DIAGNOSIS — L89620 Pressure ulcer of left heel, unstageable: Secondary | ICD-10-CM | POA: Diagnosis not present

## 2020-04-22 DIAGNOSIS — I509 Heart failure, unspecified: Secondary | ICD-10-CM | POA: Diagnosis not present

## 2020-04-22 DIAGNOSIS — D649 Anemia, unspecified: Secondary | ICD-10-CM | POA: Diagnosis not present

## 2020-04-23 DIAGNOSIS — L89622 Pressure ulcer of left heel, stage 2: Secondary | ICD-10-CM | POA: Diagnosis not present

## 2020-04-30 DIAGNOSIS — L89622 Pressure ulcer of left heel, stage 2: Secondary | ICD-10-CM | POA: Diagnosis not present

## 2020-04-30 DIAGNOSIS — S81801A Unspecified open wound, right lower leg, initial encounter: Secondary | ICD-10-CM | POA: Diagnosis not present

## 2020-05-07 DIAGNOSIS — L89622 Pressure ulcer of left heel, stage 2: Secondary | ICD-10-CM | POA: Diagnosis not present

## 2020-05-07 DIAGNOSIS — S81801A Unspecified open wound, right lower leg, initial encounter: Secondary | ICD-10-CM | POA: Diagnosis not present

## 2020-05-11 DIAGNOSIS — M810 Age-related osteoporosis without current pathological fracture: Secondary | ICD-10-CM | POA: Diagnosis not present

## 2020-05-11 DIAGNOSIS — F0390 Unspecified dementia without behavioral disturbance: Secondary | ICD-10-CM | POA: Diagnosis not present

## 2020-05-11 DIAGNOSIS — I509 Heart failure, unspecified: Secondary | ICD-10-CM | POA: Diagnosis not present

## 2020-05-11 DIAGNOSIS — K5900 Constipation, unspecified: Secondary | ICD-10-CM | POA: Diagnosis not present

## 2021-04-13 DEATH — deceased

## 2022-03-03 IMAGING — CT CT HEAD W/O CM
3 series · 15 of 47 positions shown, 18 images · non-contrast
Comparison: [id number]

CLINICAL DATA: Encephalopathy

EXAM:
CT HEAD WITHOUT CONTRAST
TECHNIQUE: Contiguous axial images were obtained from the base of the skull
through the vertex without intravenous contrast.

[Series 2: head wo · axial · 0.47mm/px · z∈[-111,+14]mm · 9 of 30 slices shown, 12 images]
[im 3/30  brain]
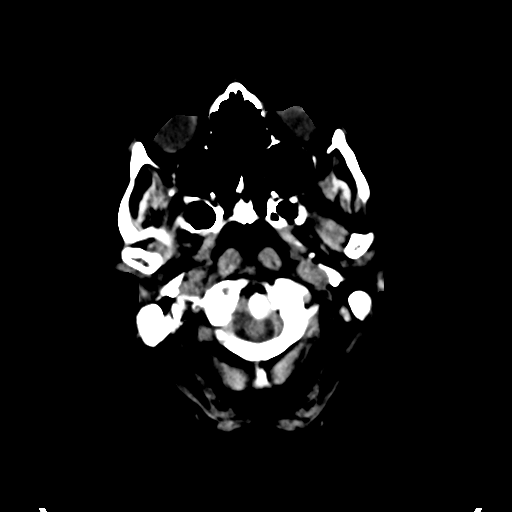
[im 3/30  bone]
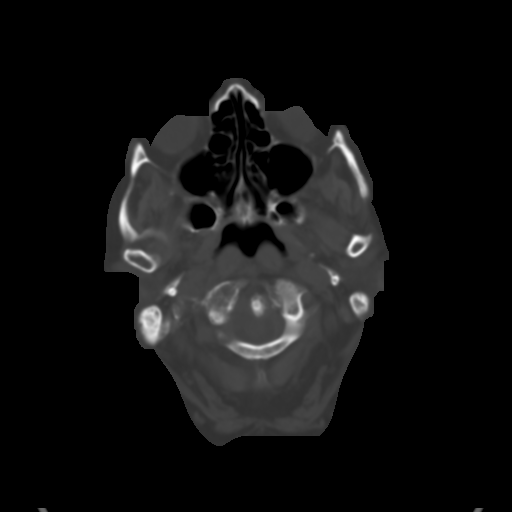
[im 6/30  brain]
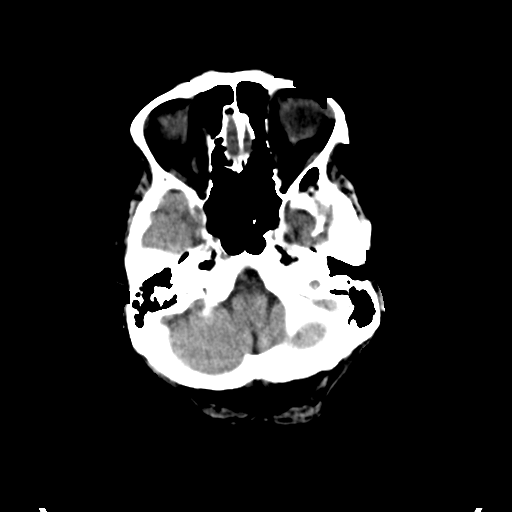
[im 9/30  brain]
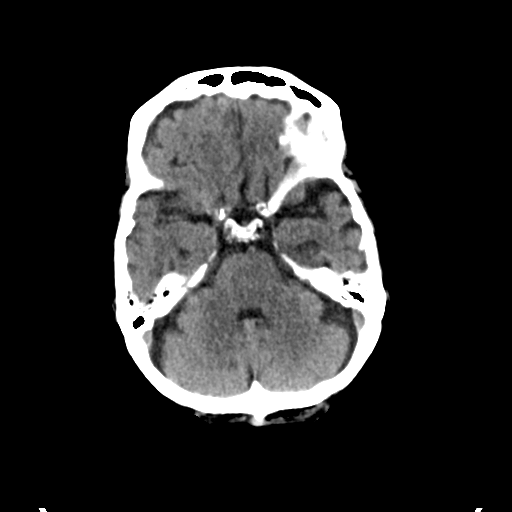
[im 12/30  brain]
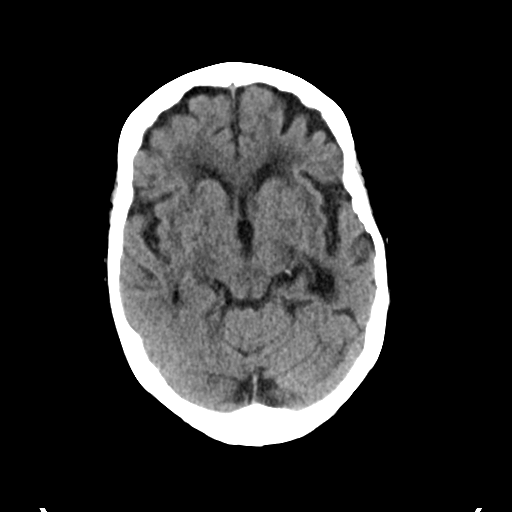
[im 16/30  brain]
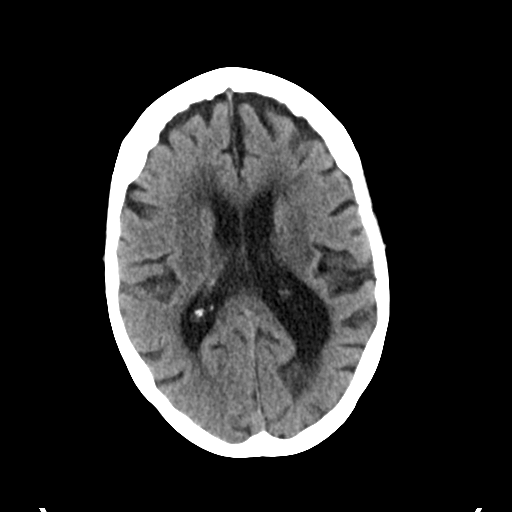
[im 16/30  bone]
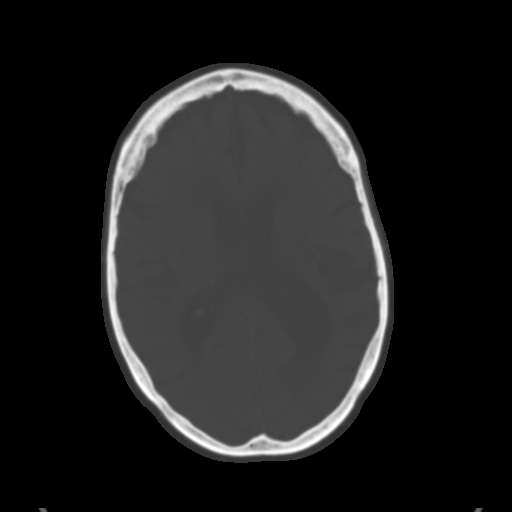
[im 19/30  brain]
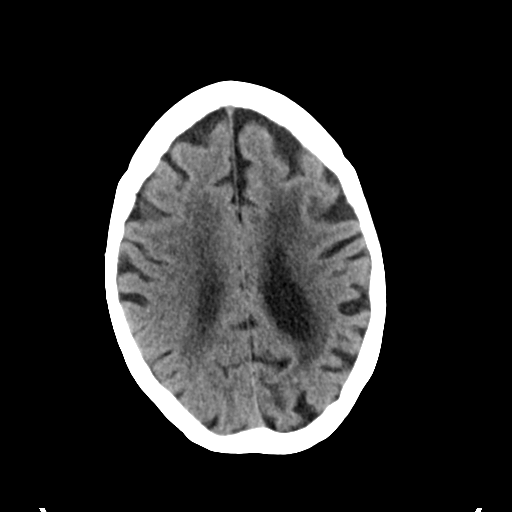
[im 22/30  brain]
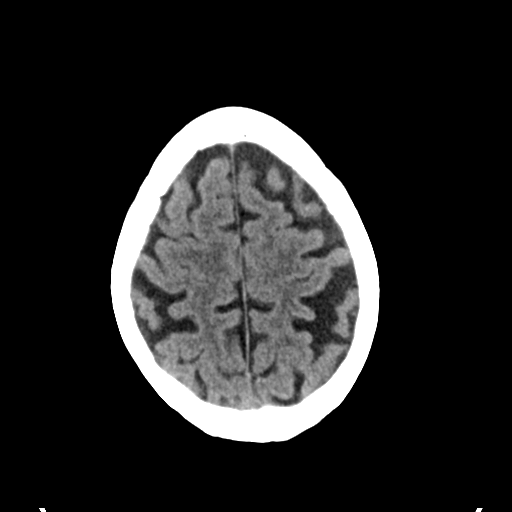
[im 25/30  brain]
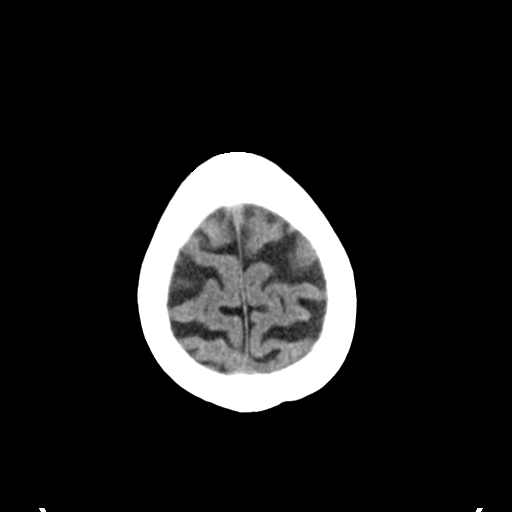
[im 28/30  brain]
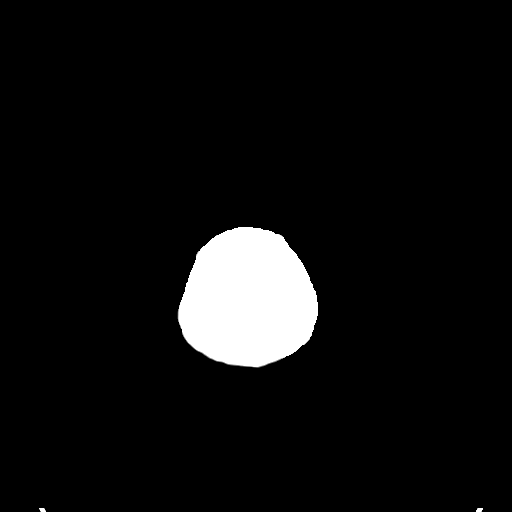
[im 28/30  bone]
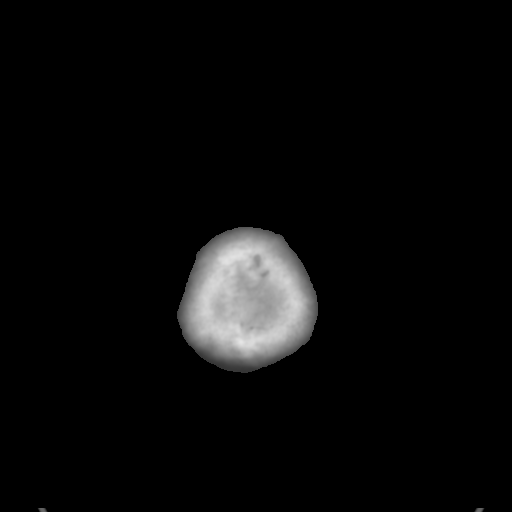

[Series 4: coronal soft tissue · coronal · 0.32mm/px · 3 of 62 slices shown]
[im 21/62  brain]
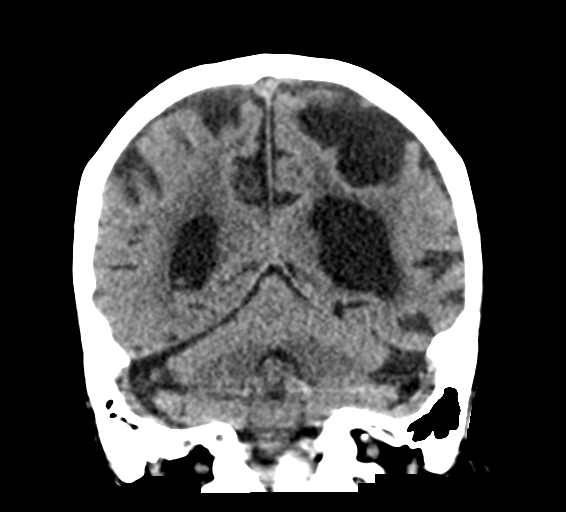
[im 28/62  brain]
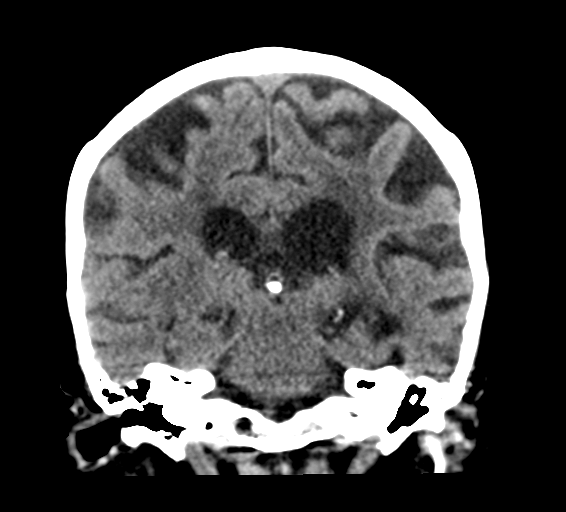
[im 34/62  brain]
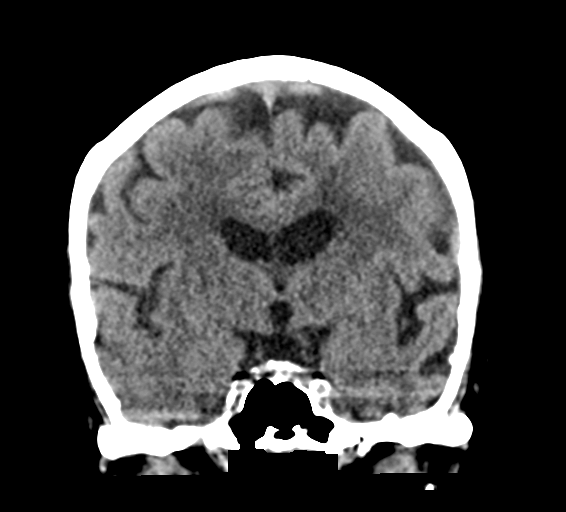

[Series 5: sagittal soft tissue · sagittal · 0.34mm/px · 3 of 46 slices shown]
[im 16/46  brain]
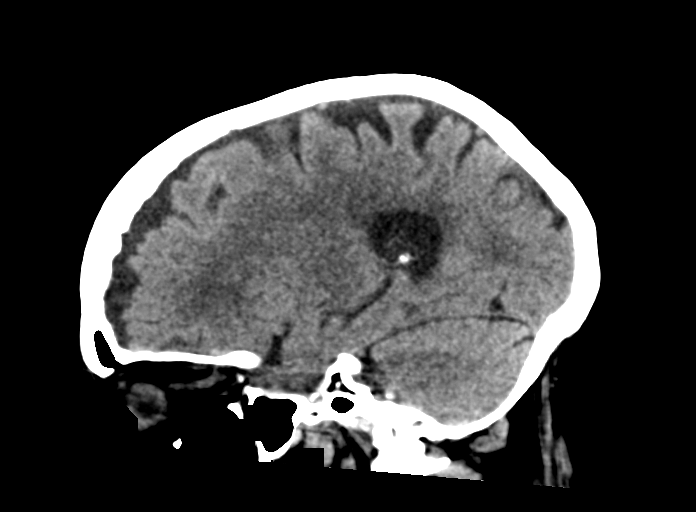
[im 23/46  brain]
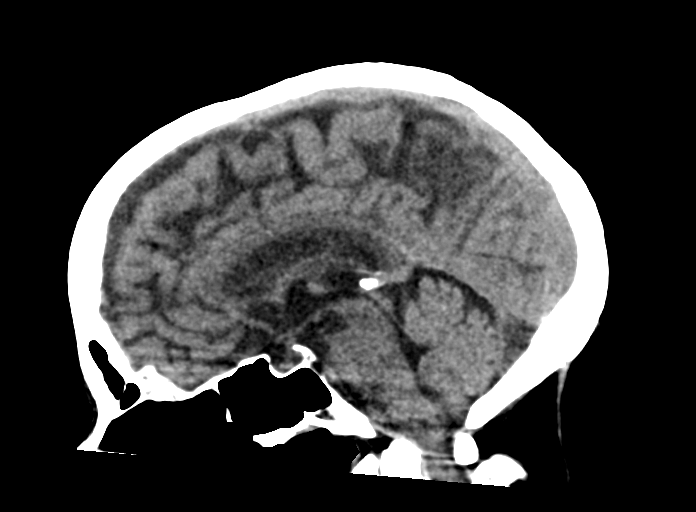
[im 31/46  brain]
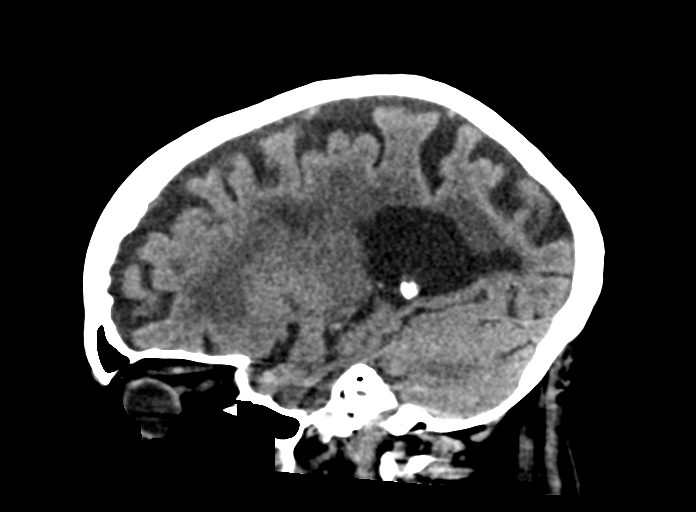

[15 of 47 positions shown; findings below may reference images not displayed]

FINDINGS: Brain: There is no acute intracranial hemorrhage, mass effect, or
edema. New but likely chronic small infarct of the left insula.
There is no extra-axial fluid collection. Patchy and confluent areas
of hypoattenuation in the supratentorial white matter are
nonspecific but likely reflect slightly increased moderate chronic
microvascular ischemic changes. Prominence of the ventricles and
sulci reflecting parenchymal volume loss has increased. There is ex
vacuo dilatation of the left lateral ventricle.

Vascular: There is atherosclerotic calcification at the skull base.

Skull: Calvarium is unremarkable.

Sinuses/Orbits: No acute finding.

Other: None.
IMPRESSION: No acute intracranial hemorrhage. New but likely chronic small
infarct of the left insula. Consider MRI if there is concern for
acute ischemia.

Chronic microvascular ischemic changes and parenchymal volume loss
with some progression since [DATE].
